# Patient Record
Sex: Female | Born: 1980 | Race: White | Hispanic: No | Marital: Married | Smoking: Current every day smoker
Health system: Southern US, Community
[De-identification: ages and names within clinical notes are randomized; demographics above are authoritative.]

## PROBLEM LIST (undated history)

## (undated) DIAGNOSIS — B182 Chronic viral hepatitis C: Secondary | ICD-10-CM

## (undated) DIAGNOSIS — F1910 Other psychoactive substance abuse, uncomplicated: Secondary | ICD-10-CM

## (undated) DIAGNOSIS — F3281 Premenstrual dysphoric disorder: Secondary | ICD-10-CM

## (undated) HISTORY — PX: PATELLECTOMY: SHX1022

## (undated) HISTORY — PX: KNEE ARTHROCENTESIS: SUR44

## (undated) HISTORY — PX: TUBAL LIGATION: SHX77

---

## 2006-05-05 ENCOUNTER — Observation Stay: Payer: Self-pay

## 2006-08-04 ENCOUNTER — Observation Stay: Payer: Self-pay | Admitting: Obstetrics and Gynecology

## 2006-08-11 ENCOUNTER — Inpatient Hospital Stay: Payer: Self-pay

## 2007-12-17 ENCOUNTER — Emergency Department: Payer: Self-pay | Admitting: Emergency Medicine

## 2008-07-25 ENCOUNTER — Emergency Department: Payer: Self-pay | Admitting: Emergency Medicine

## 2008-11-14 ENCOUNTER — Inpatient Hospital Stay: Payer: Self-pay | Admitting: Specialist

## 2011-02-27 ENCOUNTER — Emergency Department: Payer: Self-pay | Admitting: Emergency Medicine

## 2012-04-03 ENCOUNTER — Emergency Department: Payer: Self-pay | Admitting: Emergency Medicine

## 2012-05-07 ENCOUNTER — Emergency Department: Payer: Self-pay | Admitting: *Deleted

## 2013-01-27 ENCOUNTER — Emergency Department: Payer: Self-pay | Admitting: Internal Medicine

## 2013-11-16 ENCOUNTER — Emergency Department: Payer: Self-pay | Admitting: Internal Medicine

## 2013-11-16 LAB — BASIC METABOLIC PANEL
ANION GAP: 2 — AB (ref 7–16)
BUN: 9 mg/dL (ref 7–18)
CHLORIDE: 103 mmol/L (ref 98–107)
CO2: 28 mmol/L (ref 21–32)
Calcium, Total: 9.3 mg/dL (ref 8.5–10.1)
Creatinine: 0.91 mg/dL (ref 0.60–1.30)
EGFR (African American): >60
EGFR (Non-African Amer.): >60
GLUCOSE: 96 mg/dL (ref 65–99)
Osmolality: 265 (ref 275–301)
POTASSIUM: 3.7 mmol/L (ref 3.5–5.1)
SODIUM: 133 mmol/L — AB (ref 136–145)

## 2013-11-16 LAB — CBC
HCT: 37.6 % (ref 35.0–47.0)
HGB: 12.6 g/dL (ref 12.0–16.0)
MCH: 27.6 pg (ref 26.0–34.0)
MCHC: 33.6 g/dL (ref 32.0–36.0)
MCV: 82 fL (ref 80–100)
Platelet: 245 10*3/uL (ref 150–440)
RBC: 4.58 10*6/uL (ref 3.80–5.20)
RDW: 15.3 % — ABNORMAL HIGH (ref 11.5–14.5)
WBC: 10.8 10*3/uL (ref 3.6–11.0)

## 2014-01-03 ENCOUNTER — Emergency Department: Payer: Self-pay | Admitting: Emergency Medicine

## 2014-01-03 LAB — BASIC METABOLIC PANEL
ANION GAP: 4 — AB (ref 7–16)
BUN: 8 mg/dL (ref 7–18)
CALCIUM: 9 mg/dL (ref 8.5–10.1)
CHLORIDE: 100 mmol/L (ref 98–107)
CREATININE: 0.91 mg/dL (ref 0.60–1.30)
Co2: 29 mmol/L (ref 21–32)
EGFR (Non-African Amer.): >60
Glucose: 85 mg/dL (ref 65–99)
Osmolality: 264 (ref 275–301)
Potassium: 3.8 mmol/L (ref 3.5–5.1)
SODIUM: 133 mmol/L — AB (ref 136–145)

## 2014-01-03 LAB — CBC WITH DIFFERENTIAL/PLATELET
BASOS ABS: 0.1 10*3/uL (ref 0.0–0.1)
Basophil %: 1 %
EOS ABS: 0.1 10*3/uL (ref 0.0–0.7)
EOS PCT: 1.7 %
HCT: 35.8 % (ref 35.0–47.0)
HGB: 11.8 g/dL — ABNORMAL LOW (ref 12.0–16.0)
Lymphocyte #: 1.3 10*3/uL (ref 1.0–3.6)
Lymphocyte %: 17.5 %
MCH: 26.5 pg (ref 26.0–34.0)
MCHC: 32.9 g/dL (ref 32.0–36.0)
MCV: 81 fL (ref 80–100)
MONOS PCT: 7.8 %
Monocyte #: 0.6 x10 3/mm (ref 0.2–0.9)
NEUTROS ABS: 5.5 10*3/uL (ref 1.4–6.5)
Neutrophil %: 72 %
Platelet: 231 10*3/uL (ref 150–440)
RBC: 4.43 10*6/uL (ref 3.80–5.20)
RDW: 16.6 % — ABNORMAL HIGH (ref 11.5–14.5)
WBC: 7.7 10*3/uL (ref 3.6–11.0)

## 2014-01-03 LAB — TROPONIN I

## 2014-01-08 LAB — CULTURE, BLOOD (SINGLE)

## 2014-02-15 ENCOUNTER — Emergency Department: Payer: Self-pay

## 2014-02-15 ENCOUNTER — Emergency Department: Payer: Self-pay | Admitting: Emergency Medicine

## 2014-02-15 LAB — ETHANOL: Ethanol %: <0.003 % (ref 0.000–0.080)

## 2014-02-15 LAB — COMPREHENSIVE METABOLIC PANEL
ALT: 17 U/L (ref 12–78)
AST: 25 U/L (ref 15–37)
Albumin: 3.6 g/dL (ref 3.4–5.0)
Alkaline Phosphatase: 80 U/L
Anion Gap: 7 (ref 7–16)
BILIRUBIN TOTAL: 0.4 mg/dL (ref 0.2–1.0)
BUN: 12 mg/dL (ref 7–18)
CREATININE: 1.15 mg/dL (ref 0.60–1.30)
Calcium, Total: 9.3 mg/dL (ref 8.5–10.1)
Chloride: 101 mmol/L (ref 98–107)
Co2: 25 mmol/L (ref 21–32)
Glucose: 117 mg/dL — ABNORMAL HIGH (ref 65–99)
Osmolality: 267 (ref 275–301)
Potassium: 4.1 mmol/L (ref 3.5–5.1)
SODIUM: 133 mmol/L — AB (ref 136–145)
TOTAL PROTEIN: 8.7 g/dL — AB (ref 6.4–8.2)

## 2014-02-15 LAB — CBC
HCT: 35.3 % (ref 35.0–47.0)
HGB: 11.3 g/dL — ABNORMAL LOW (ref 12.0–16.0)
MCH: 25.3 pg — ABNORMAL LOW (ref 26.0–34.0)
MCHC: 32.1 g/dL (ref 32.0–36.0)
MCV: 79 fL — AB (ref 80–100)
PLATELETS: 221 10*3/uL (ref 150–440)
RBC: 4.48 10*6/uL (ref 3.80–5.20)
RDW: 17.1 % — ABNORMAL HIGH (ref 11.5–14.5)
WBC: 4.3 10*3/uL (ref 3.6–11.0)

## 2014-02-15 LAB — SALICYLATE LEVEL: SALICYLATES, SERUM: 1.8 mg/dL

## 2014-02-15 LAB — ACETAMINOPHEN LEVEL

## 2014-02-16 LAB — URINALYSIS, COMPLETE
Bacteria: NONE SEEN
Bilirubin,UR: NEGATIVE
Blood: NEGATIVE
Glucose,UR: NEGATIVE mg/dL (ref 0–75)
Ketone: NEGATIVE
Leukocyte Esterase: NEGATIVE
NITRITE: NEGATIVE
PH: 5 (ref 4.5–8.0)
PROTEIN: NEGATIVE
RBC,UR: 1 /HPF (ref 0–5)
SPECIFIC GRAVITY: 1.015 (ref 1.003–1.030)

## 2014-02-16 LAB — DRUG SCREEN, URINE
AMPHETAMINES, UR SCREEN: NEGATIVE (ref ?–1000)
BARBITURATES, UR SCREEN: NEGATIVE (ref ?–200)
BENZODIAZEPINE, UR SCRN: NEGATIVE (ref ?–200)
CANNABINOID 50 NG, UR ~~LOC~~: NEGATIVE (ref ?–50)
Cocaine Metabolite,Ur ~~LOC~~: POSITIVE (ref ?–300)
MDMA (ECSTASY) UR SCREEN: NEGATIVE (ref ?–500)
METHADONE, UR SCREEN: NEGATIVE (ref ?–300)
Opiate, Ur Screen: POSITIVE (ref ?–300)
Phencyclidine (PCP) Ur S: NEGATIVE (ref ?–25)
Tricyclic, Ur Screen: NEGATIVE (ref ?–1000)

## 2014-03-12 ENCOUNTER — Emergency Department: Payer: Self-pay | Admitting: Emergency Medicine

## 2014-03-12 LAB — DRUG SCREEN, URINE
AMPHETAMINES, UR SCREEN: NEGATIVE (ref ?–1000)
BARBITURATES, UR SCREEN: NEGATIVE (ref ?–200)
BENZODIAZEPINE, UR SCRN: NEGATIVE (ref ?–200)
Cannabinoid 50 Ng, Ur ~~LOC~~: NEGATIVE (ref ?–50)
Cocaine Metabolite,Ur ~~LOC~~: POSITIVE (ref ?–300)
MDMA (ECSTASY) UR SCREEN: NEGATIVE (ref ?–500)
Methadone, Ur Screen: NEGATIVE (ref ?–300)
Opiate, Ur Screen: POSITIVE (ref ?–300)
Phencyclidine (PCP) Ur S: NEGATIVE (ref ?–25)
Tricyclic, Ur Screen: NEGATIVE (ref ?–1000)

## 2014-03-12 LAB — CBC
HCT: 36.4 % (ref 35.0–47.0)
HGB: 11.7 g/dL — ABNORMAL LOW (ref 12.0–16.0)
MCH: 25.8 pg — AB (ref 26.0–34.0)
MCHC: 32.2 g/dL (ref 32.0–36.0)
MCV: 80 fL (ref 80–100)
PLATELETS: 217 10*3/uL (ref 150–440)
RBC: 4.54 10*6/uL (ref 3.80–5.20)
RDW: 17.2 % — AB (ref 11.5–14.5)
WBC: 9.8 10*3/uL (ref 3.6–11.0)

## 2014-03-12 LAB — COMPREHENSIVE METABOLIC PANEL
ALBUMIN: 3.7 g/dL (ref 3.4–5.0)
Alkaline Phosphatase: 70 U/L
Anion Gap: 4 — ABNORMAL LOW (ref 7–16)
BUN: 12 mg/dL (ref 7–18)
Bilirubin,Total: 0.2 mg/dL (ref 0.2–1.0)
CHLORIDE: 104 mmol/L (ref 98–107)
Calcium, Total: 9.1 mg/dL (ref 8.5–10.1)
Co2: 27 mmol/L (ref 21–32)
Creatinine: 0.99 mg/dL (ref 0.60–1.30)
EGFR (African American): >60
EGFR (Non-African Amer.): >60
Glucose: 87 mg/dL (ref 65–99)
Osmolality: 269 (ref 275–301)
Potassium: 3.8 mmol/L (ref 3.5–5.1)
SGOT(AST): 26 U/L (ref 15–37)
SGPT (ALT): 20 U/L (ref 12–78)
SODIUM: 135 mmol/L — AB (ref 136–145)
Total Protein: 8.1 g/dL (ref 6.4–8.2)

## 2014-03-12 LAB — URINALYSIS, COMPLETE
BILIRUBIN, UR: NEGATIVE
Blood: NEGATIVE
GLUCOSE, UR: NEGATIVE mg/dL (ref 0–75)
Ketone: NEGATIVE
Nitrite: NEGATIVE
Ph: 5 (ref 4.5–8.0)
Protein: NEGATIVE
RBC,UR: 3 /HPF (ref 0–5)
SPECIFIC GRAVITY: 1.016 (ref 1.003–1.030)

## 2014-03-12 LAB — SALICYLATE LEVEL: Salicylates, Serum: 2.6 mg/dL

## 2014-03-12 LAB — ACETAMINOPHEN LEVEL

## 2014-03-12 LAB — ETHANOL
Ethanol %: <0.003 % (ref 0.000–0.080)
Ethanol: <3 mg/dL

## 2014-05-30 ENCOUNTER — Emergency Department (HOSPITAL_COMMUNITY)
Admission: EM | Admit: 2014-05-30 | Discharge: 2014-05-30 | Disposition: A | Discharge status: Home / Self Care | Payer: Medicaid Other | Attending: Emergency Medicine | Admitting: Emergency Medicine

## 2014-05-30 ENCOUNTER — Encounter (HOSPITAL_COMMUNITY): Payer: Self-pay | Admitting: Emergency Medicine

## 2014-05-30 ENCOUNTER — Emergency Department (HOSPITAL_COMMUNITY): Payer: Medicaid Other

## 2014-05-30 DIAGNOSIS — K029 Dental caries, unspecified: Secondary | ICD-10-CM

## 2014-05-30 DIAGNOSIS — F17200 Nicotine dependence, unspecified, uncomplicated: Secondary | ICD-10-CM | POA: Diagnosis not present

## 2014-05-30 DIAGNOSIS — Z8619 Personal history of other infectious and parasitic diseases: Secondary | ICD-10-CM | POA: Insufficient documentation

## 2014-05-30 DIAGNOSIS — X500XXA Overexertion from strenuous movement or load, initial encounter: Secondary | ICD-10-CM | POA: Diagnosis not present

## 2014-05-30 DIAGNOSIS — Y9289 Other specified places as the place of occurrence of the external cause: Secondary | ICD-10-CM | POA: Diagnosis not present

## 2014-05-30 DIAGNOSIS — K089 Disorder of teeth and supporting structures, unspecified: Secondary | ICD-10-CM | POA: Diagnosis present

## 2014-05-30 DIAGNOSIS — S8990XA Unspecified injury of unspecified lower leg, initial encounter: Secondary | ICD-10-CM | POA: Diagnosis not present

## 2014-05-30 DIAGNOSIS — S99929A Unspecified injury of unspecified foot, initial encounter: Secondary | ICD-10-CM

## 2014-05-30 DIAGNOSIS — Y9389 Activity, other specified: Secondary | ICD-10-CM | POA: Diagnosis not present

## 2014-05-30 DIAGNOSIS — S99919A Unspecified injury of unspecified ankle, initial encounter: Secondary | ICD-10-CM

## 2014-05-30 DIAGNOSIS — M25562 Pain in left knee: Secondary | ICD-10-CM

## 2014-05-30 HISTORY — DX: Chronic viral hepatitis C: B18.2

## 2014-05-30 MED ORDER — OXYCODONE HCL 5 MG PO TABS: 5.0000 mg | ORAL_TABLET | ORAL | Status: DC | PRN

## 2014-05-30 MED ORDER — PENICILLIN V POTASSIUM 500 MG PO TABS: 500.0000 mg | ORAL_TABLET | Freq: Three times a day (TID) | ORAL | Status: DC

## 2014-05-30 MED ORDER — OXYCODONE HCL 5 MG PO TABS
5.0000 mg | ORAL_TABLET | Freq: Once | ORAL | Status: AC
  Administered 2014-05-30: 5 mg via ORAL
  Filled 2014-05-30: qty 1

## 2014-05-30 MED ORDER — NAPROXEN 500 MG PO TABS: 500.0000 mg | ORAL_TABLET | Freq: Two times a day (BID) | ORAL | Status: DC

## 2014-05-30 NOTE — ED Notes (Signed)
Pt c/o right lower dental pain x 2 weeks worse today; pt sts pain in left knee after twisting; pt sts hx of problems with same knee

## 2014-05-30 NOTE — ED Notes (Signed)
Patient returned from X-ray 

## 2014-05-30 NOTE — ED Provider Notes (Signed)
CSN: [id number]     Arrival date & time 05/30/14  1612 History  This chart was scribed for non-physician practitioner, Felicie Mornavid Noga Fogg, NP working with Richardean Canalavid H Yao, MD by Greggory StallionKayla Andersen, ED scribe. This patient was seen in room TR06C/TR06C and the patient's care was started at 5:46 PM.   Chief Complaint  Patient presents with  . Dental Pain   The history is provided by the patient. No language interpreter was used.   HPI Comments: Jasmine [name] is a [age] y.o. female who presents to the Emergency Department complaining of gradual onset right lower dental pain that started 2 weeks ago. States it worsened today. She tried to call her dentist to get an appointment but was unsuccessful. Denies fever. Pt is also complaining of left knee pain that started after she twisted it while getting out of the shower 2 days ago. Reports history of problems with left knee. States she had limited flexion of her knee with past injury but physical therapy improved it. She states she can not flex it since injury 2 days ago.   Past Medical History  Diagnosis Date  . Hep C w/o coma, chronic    History reviewed. No pertinent past surgical history. History reviewed. No pertinent family history. History  Substance Use Topics  . Smoking status: Current Every Day Smoker  . Smokeless tobacco: Not on file  . Alcohol Use: Yes     Comment: occ   OB History   Grav Para Term Preterm Abortions TAB SAB Ect Mult Living                 Review of Systems  Constitutional: Negative for fever.  HENT: Positive for dental problem.   Musculoskeletal: Positive for arthralgias.  All other systems reviewed and are negative.  Allergies  Review of patient's allergies indicates no known allergies.  Home Medications   Prior to Admission medications   Not on File   BP 127/69  Pulse 92  Temp(Src) 98.5 F (36.9 C) (Oral)  Resp 16  SpO2 99%  LMP 05/30/2014  Physical Exam  Nursing note and vitals reviewed. Constitutional:  She is oriented to person, place, and time. She appears well-developed and well-nourished. No distress.  HENT:  Head: Normocephalic and atraumatic.  Tooth #29 broken down to gumline.   Eyes: Conjunctivae and EOM are normal.  Neck: Neck supple. No tracheal deviation present.  Cardiovascular: Normal rate, regular rhythm, normal heart sounds and intact distal pulses.   Pulmonary/Chest: Effort normal and breath sounds normal. No respiratory distress. She has no wheezes. She has no rales.  Musculoskeletal: Normal range of motion.  Limited flexion of left knee. Diffuse tenderness.   Neurological: She is alert and oriented to person, place, and time.  Skin: Skin is warm and dry.  Psychiatric: She has a normal mood and affect. Her behavior is normal.    ED Course  Procedures (including critical care time)  DIAGNOSTIC STUDIES: Oxygen Saturation is 99% on RA, normal by my interpretation.    COORDINATION OF CARE: 5:50 PM-Discussed treatment plan which includes knee xray, an antibiotic and pain medication with pt at bedside and pt agreed to plan. Will give pt dental and orthopedic referrals and advised her to follow up.    Labs Review Labs Reviewed - No data to display  Imaging Review Dg Knee Complete 4 Views Left  05/30/2014   CLINICAL DATA:  Left knee pain since a twisting injury 2 days ago. Previous patella surgery.  EXAM:  LEFT KNEE - COMPLETE 4+ VIEW  COMPARISON:  None.  FINDINGS: There is no fracture, dislocation, or joint effusion. There is slight deformity of the lower pole of the patella. Medial and lateral compartments appear normal.  IMPRESSION: No acute abnormalities.   Electronically Signed   By: Geanie Cooley M.D.   On: 05/30/2014 17:56     EKG Interpretation None     Radiology results reviewed and shared with patient. MDM   Final diagnoses:  None    Left knee pain.  No acute finding on xray.  Follow-up with orthopedist who performed prior surgery.  Dental pain.   Antibiotic, analgesic, dental follow-up.  I personally performed the services described in this documentation, which was scribed in my presence. The recorded information has been reviewed and is accurate.  Jimmye Norman, NP 05/31/14 [phone]

## 2014-05-30 NOTE — Discharge Instructions (Signed)
Dental Pain  A tooth ache may be caused by cavities (tooth decay). Cavities expose the nerve of the tooth to air and hot or cold temperatures. It may come from an infection or abscess (also called a boil or furuncle) around your tooth. It is also often caused by dental caries (tooth decay). This causes the pain you are having.  DIAGNOSIS   Your caregiver can diagnose this problem by exam.  TREATMENT    If caused by an infection, it may be treated with medications which kill germs (antibiotics) and pain medications as prescribed by your caregiver. Take medications as directed.   Only take over-the-counter or prescription medicines for pain, discomfort, or fever as directed by your caregiver.   Whether the tooth ache today is caused by infection or dental disease, you should see your dentist as soon as possible for further care.  SEEK MEDICAL CARE IF:  The exam and treatment you received today has been provided on an emergency basis only. This is not a substitute for complete medical or dental care. If your problem worsens or new problems (symptoms) appear, and you are unable to meet with your dentist, call or return to this location.  SEEK IMMEDIATE MEDICAL CARE IF:    You have a fever.   You develop redness and swelling of your face, jaw, or neck.   You are unable to open your mouth.   You have severe pain uncontrolled by pain medicine.  MAKE SURE YOU:    Understand these instructions.   Will watch your condition.   Will get help right away if you are not doing well or get worse.  Document Released: 09/29/2005 Document Revised: 12/22/2011 Document Reviewed: 05/17/2008  ExitCare Patient Information 2015 [employer]. This information is not intended to replace advice given to you by your health care provider. Make sure you discuss any questions you have with your health care provider.  Knee Pain  The knee is the complex joint between your thigh and your lower leg. It is made up of bones, tendons,  ligaments, and cartilage. The bones that make up the knee are:   The femur in the thigh.   The tibia and fibula in the lower leg.   The patella or kneecap riding in the groove on the lower femur.  CAUSES   Knee pain is a common complaint with many causes. A few of these causes are:   Injury, such as:   A ruptured ligament or tendon injury.   Torn cartilage.   Medical conditions, such as:   Gout   Arthritis   Infections   Overuse, over training, or overdoing a physical activity.  Knee pain can be minor or severe. Knee pain can accompany debilitating injury. Minor knee problems often respond well to self-care measures or get well on their own. More serious injuries may need medical intervention or even surgery.  SYMPTOMS  The knee is complex. Symptoms of knee problems can vary widely. Some of the problems are:   Pain with movement and weight bearing.   Swelling and tenderness.   Buckling of the knee.   Inability to straighten or extend your knee.   Your knee locks and you cannot straighten it.   Warmth and redness with pain and fever.   Deformity or dislocation of the kneecap.  DIAGNOSIS   Determining what is wrong may be very straight forward such as when there is an injury. It can also be challenging because of the complexity of the   knee. Tests to make a diagnosis may include:   Your caregiver taking a history and doing a physical exam.   Routine X-rays can be used to rule out other problems. X-rays will not reveal a cartilage tear. Some injuries of the knee can be diagnosed by:   Arthroscopy a surgical technique by which a small video camera is inserted through tiny incisions on the sides of the knee. This procedure is used to examine and repair internal knee joint problems. Tiny instruments can be used during arthroscopy to repair the torn knee cartilage (meniscus).   Arthrography is a radiology technique. A contrast liquid is directly injected into the knee joint. Internal structures of the  knee joint then become visible on X-ray film.   An MRI scan is a non X-ray radiology procedure in which magnetic fields and a computer produce two- or three-dimensional images of the inside of the knee. Cartilage tears are often visible using an MRI scanner. MRI scans have largely replaced arthrography in diagnosing cartilage tears of the knee.   Blood work.   Examination of the fluid that helps to lubricate the knee joint (synovial fluid). This is done by taking a sample out using a needle and a syringe.  TREATMENT  The treatment of knee problems depends on the cause. Some of these treatments are:   Depending on the injury, proper casting, splinting, surgery, or physical therapy care will be needed.   Give yourself adequate recovery time. Do not overuse your joints. If you begin to get sore during workout routines, back off. Slow down or do fewer repetitions.   For repetitive activities such as cycling or running, maintain your strength and nutrition.   Alternate muscle groups. For example, if you are a weight lifter, work the upper body on one day and the lower body the next.   Either tight or weak muscles do not give the proper support for your knee. Tight or weak muscles do not absorb the stress placed on the knee joint. Keep the muscles surrounding the knee strong.   Take care of mechanical problems.   If you have flat feet, orthotics or special shoes may help. See your caregiver if you need help.   Arch supports, sometimes with wedges on the inner or outer aspect of the heel, can help. These can shift pressure away from the side of the knee most bothered by osteoarthritis.   A brace called an "unloader" brace also may be used to help ease the pressure on the most arthritic side of the knee.   If your caregiver has prescribed crutches, braces, wraps or ice, use as directed. The acronym for this is PRICE. This means protection, rest, ice, compression, and elevation.   Nonsteroidal anti-inflammatory  drugs (NSAIDs), can help relieve pain. But if taken immediately after an injury, they may actually increase swelling. Take NSAIDs with food in your stomach. Stop them if you develop stomach problems. Do not take these if you have a history of ulcers, stomach pain, or bleeding from the bowel. Do not take without your caregiver's approval if you have problems with fluid retention, heart failure, or kidney problems.   For ongoing knee problems, physical therapy may be helpful.   Glucosamine and chondroitin are over-the-counter dietary supplements. Both may help relieve the pain of osteoarthritis in the knee. These medicines are different from the usual anti-inflammatory drugs. Glucosamine may decrease the rate of cartilage destruction.   Injections of a corticosteroid drug into your knee joint may   help reduce the symptoms of an arthritis flare-up. They may provide pain relief that lasts a few months. You may have to wait a few months between injections. The injections do have a small increased risk of infection, water retention, and elevated blood sugar levels.   Hyaluronic acid injected into damaged joints may ease pain and provide lubrication. These injections may work by reducing inflammation. A series of shots may give relief for as long as 6 months.   Topical painkillers. Applying certain ointments to your skin may help relieve the pain and stiffness of osteoarthritis. Ask your pharmacist for suggestions. Many over the-counter products are approved for temporary relief of arthritis pain.   In some countries, doctors often prescribe topical NSAIDs for relief of chronic conditions such as arthritis and tendinitis. A review of treatment with NSAID creams found that they worked as well as oral medications but without the serious side effects.  PREVENTION   Maintain a healthy weight. Extra pounds put more strain on your joints.   Get strong, stay limber. Weak muscles are a common cause of knee injuries.  Stretching is important. Include flexibility exercises in your workouts.   Be smart about exercise. If you have osteoarthritis, chronic knee pain or recurring injuries, you may need to change the way you exercise. This does not mean you have to stop being active. If your knees ache after jogging or playing basketball, consider switching to swimming, water aerobics, or other low-impact activities, at least for a few days a week. Sometimes limiting high-impact activities will provide relief.   Make sure your shoes fit well. Choose footwear that is right for your sport.   Protect your knees. Use the proper gear for knee-sensitive activities. Use kneepads when playing volleyball or laying carpet. Buckle your seat belt every time you drive. Most shattered kneecaps occur in car accidents.   Rest when you are tired.  SEEK MEDICAL CARE IF:   You have knee pain that is continual and does not seem to be getting better.   SEEK IMMEDIATE MEDICAL CARE IF:   Your knee joint feels hot to the touch and you have a high fever.  MAKE SURE YOU:    Understand these instructions.   Will watch your condition.   Will get help right away if you are not doing well or get worse.  Document Released: 07/27/2007 Document Revised: 12/22/2011 Document Reviewed: 07/27/2007  [employer] Patient Information 2015 [employer]. This information is not intended to replace advice given to you by your health care provider. Make sure you discuss any questions you have with your health care provider.

## 2014-05-31 NOTE — ED Provider Notes (Signed)
Medical screening examination/treatment/procedure(s) were performed by non-physician practitioner and as supervising physician I was immediately available for consultation/collaboration.   EKG Interpretation None        David H Yao, MD 05/31/14 0956 

## 2014-06-06 ENCOUNTER — Emergency Department (HOSPITAL_COMMUNITY)
Admission: EM | Admit: 2014-06-06 | Discharge: 2014-06-06 | Disposition: A | Discharge status: Home / Self Care | Payer: Medicaid Other | Attending: Family Medicine | Admitting: Family Medicine

## 2014-06-06 ENCOUNTER — Encounter (HOSPITAL_COMMUNITY): Payer: Self-pay | Admitting: Emergency Medicine

## 2014-06-06 DIAGNOSIS — Z791 Long term (current) use of non-steroidal anti-inflammatories (NSAID): Secondary | ICD-10-CM | POA: Insufficient documentation

## 2014-06-06 DIAGNOSIS — M25569 Pain in unspecified knee: Secondary | ICD-10-CM | POA: Insufficient documentation

## 2014-06-06 DIAGNOSIS — F17200 Nicotine dependence, unspecified, uncomplicated: Secondary | ICD-10-CM | POA: Diagnosis not present

## 2014-06-06 DIAGNOSIS — Z9889 Other specified postprocedural states: Secondary | ICD-10-CM | POA: Insufficient documentation

## 2014-06-06 DIAGNOSIS — M25562 Pain in left knee: Secondary | ICD-10-CM

## 2014-06-06 DIAGNOSIS — Z792 Long term (current) use of antibiotics: Secondary | ICD-10-CM | POA: Insufficient documentation

## 2014-06-06 DIAGNOSIS — G8929 Other chronic pain: Secondary | ICD-10-CM | POA: Diagnosis not present

## 2014-06-06 MED ORDER — KETOROLAC TROMETHAMINE 10 MG PO TABS: 10.0000 mg | ORAL_TABLET | Freq: Four times a day (QID) | ORAL | Status: DC | PRN

## 2014-06-06 MED ORDER — KETOROLAC TROMETHAMINE 30 MG/ML IJ SOLN
30.0000 mg | Freq: Once | INTRAMUSCULAR | Status: AC
  Administered 2014-06-06: 30 mg via INTRAMUSCULAR
  Filled 2014-06-06: qty 1

## 2014-06-06 NOTE — ED Notes (Signed)
C/o pain in left knee onset 2 weeks ago states she isn't able to work. States she isn't able to see ortho until next month.

## 2014-06-06 NOTE — ED Provider Notes (Signed)
CSN: [id number]     Arrival date & time 06/06/14  1213 History   First MD Initiated Contact with Patient 06/06/14 1322     Chief Complaint  Patient presents with  . Knee Pain     (Consider location/radiation/quality/duration/timing/severity/associated sxs/prior Treatment) Patient is a 33 y.o. female presenting with knee pain. The history is provided by the patient.  Knee Pain Location:  Knee Time since incident:  10 days Injury: no   Knee location:  L knee Pain details:    Quality:  Sharp   Radiates to:  Does not radiate   Severity:  Moderate   Onset quality:  Gradual Chronicity:  Recurrent (seen 8/18 for knee pain, s/p tkr in [place] unable to get ortho f/u for sev wks.) Dislocation: no   Prior injury to area:  Yes Ineffective treatments:  None tried Associated symptoms: decreased ROM and stiffness   Associated symptoms: no back pain, no numbness and no swelling     Past Medical History  Diagnosis Date  . Hep C w/o coma, chronic    Past Surgical History  Procedure Laterality Date  . Knee arthrocentesis Left   . Tubal ligation N/A    History reviewed. No pertinent family history. History  Substance Use Topics  . Smoking status: Current Every Day Smoker  . Smokeless tobacco: Not on file  . Alcohol Use: Yes     Comment: occ   OB History   Grav Para Term Preterm Abortions TAB SAB Ect Mult Living                 Review of Systems  Constitutional: Negative.   Musculoskeletal: Positive for gait problem and stiffness. Negative for back pain and joint swelling.  Skin: Negative.       Allergies  Review of patient's allergies indicates no known allergies.  Home Medications   Prior to Admission medications   Medication Sig Start Date End Date Taking? Authorizing Provider  ketorolac (TORADOL) 10 MG tablet Take 1 tablet (10 mg total) by mouth every 6 (six) hours as needed. For pain 06/06/14   Linna Hoff, MD  naproxen (NAPROSYN) 500 MG tablet Take 1 tablet (500  mg total) by mouth 2 (two) times daily. 05/30/14   Jimmye Norman, NP  oxyCODONE (ROXICODONE) 5 MG immediate release tablet Take 1 tablet (5 mg total) by mouth every 4 (four) hours as needed for severe pain. 05/30/14   Jimmye Norman, NP  penicillin v potassium (VEETID) 500 MG tablet Take 1 tablet (500 mg total) by mouth 3 (three) times daily. 05/30/14   Jimmye Norman, NP   BP 107/59  Pulse 75  Temp(Src) 98.1 F (36.7 C) (Oral)  Resp 16  Ht  (1.778 m)  Wt 165 lb (74.844 kg)  BMI 23.68 kg/m2  SpO2 100%  LMP 05/30/2014 Physical Exam  Nursing note and vitals reviewed. Constitutional: She is oriented to person, place, and time. She appears well-developed and well-nourished.  Musculoskeletal: She exhibits tenderness.       Left knee: She exhibits decreased range of motion. She exhibits no swelling, no effusion and no bony tenderness. Tenderness found. Medial joint line and MCL tenderness noted. No patellar tendon tenderness noted.       Legs: Neurological: She is alert and oriented to person, place, and time.  Skin: Skin is warm and dry.    ED Course  Procedures (including critical care time) Labs Review Labs Reviewed - No data to display  Imaging  Review No results found.   EKG Interpretation None      MDM   Final diagnoses:  Knee pain, chronic, left        Linna Hoff, MD 06/13/14 1248

## 2014-06-06 NOTE — ED Provider Notes (Signed)
CSN: [id number]     Arrival date & time 06/06/14  1213 History   First MD Initiated Contact with Patient 06/06/14 1322     Chief Complaint  Patient presents with  . Knee Pain     (Consider location/radiation/quality/duration/timing/severity/associated sxs/prior Treatment) HPI  Past Medical History  Diagnosis Date  . Hep C w/o coma, chronic    Past Surgical History  Procedure Laterality Date  . Knee arthrocentesis Left   . Tubal ligation N/A    History reviewed. No pertinent family history. History  Substance Use Topics  . Smoking status: Current Every Day Smoker  . Smokeless tobacco: Not on file  . Alcohol Use: Yes     Comment: occ   OB History   Grav Para Term Preterm Abortions TAB SAB Ect Mult Living                 Review of Systems    Allergies  Review of patient's allergies indicates no known allergies.  Home Medications   Prior to Admission medications   Medication Sig Start Date End Date Taking? Authorizing Provider  ketorolac (TORADOL) 10 MG tablet Take 1 tablet (10 mg total) by mouth every 6 (six) hours as needed. For pain 06/06/14   Linna Hoff, MD  naproxen (NAPROSYN) 500 MG tablet Take 1 tablet (500 mg total) by mouth 2 (two) times daily. 05/30/14   Jimmye Norman, NP  oxyCODONE (ROXICODONE) 5 MG immediate release tablet Take 1 tablet (5 mg total) by mouth every 4 (four) hours as needed for severe pain. 05/30/14   Jimmye Norman, NP  penicillin v potassium (VEETID) 500 MG tablet Take 1 tablet (500 mg total) by mouth 3 (three) times daily. 05/30/14   Jimmye Norman, NP   BP 107/59  Pulse 75  Temp(Src) 98.1 F (36.7 C) (Oral)  Resp 16  Ht  (1.778 m)  Wt 165 lb (74.844 kg)  BMI 23.68 kg/m2  SpO2 100%  LMP 05/30/2014 Physical Exam  Nursing note and vitals reviewed. Constitutional: She is oriented to person, place, and time. She appears well-developed and well-nourished.  Musculoskeletal: She exhibits tenderness.       Left knee: She  exhibits normal range of motion, no swelling, no effusion, no deformity, normal alignment, normal patellar mobility, no bony tenderness, normal meniscus and no MCL laxity. Tenderness found. Medial joint line and lateral joint line tenderness noted.       Legs: Neurological: She is alert and oriented to person, place, and time.  Skin: Skin is warm and dry.    ED Course  Procedures (including critical care time) Labs Review Labs Reviewed - No data to display  Imaging Review No results found.   EKG Interpretation None      MDM   Final diagnoses:  Knee pain, chronic, left        Linna Hoff, MD 06/06/14 [phone]

## 2014-06-06 NOTE — Discharge Instructions (Signed)
Wear knee support as needed and use medicine as needed, see your orthopedist as planned.

## 2014-06-07 NOTE — ED Provider Notes (Signed)
CSN: [id number]     Arrival date & time 06/06/14  1213 History   First MD Initiated Contact with Patient 06/06/14 1322     Chief Complaint  Patient presents with  . Knee Pain     (Consider location/radiation/quality/duration/timing/severity/associated sxs/prior Treatment) HPI  Past Medical History  Diagnosis Date  . Hep C w/o coma, chronic    Past Surgical History  Procedure Laterality Date  . Knee arthrocentesis Left   . Tubal ligation N/A    History reviewed. No pertinent family history. History  Substance Use Topics  . Smoking status: Current Every Day Smoker  . Smokeless tobacco: Not on file  . Alcohol Use: Yes     Comment: occ   OB History   Grav Para Term Preterm Abortions TAB SAB Ect Mult Living                 Review of Systems    Allergies  Review of patient's allergies indicates no known allergies.  Home Medications   Prior to Admission medications   Medication Sig Start Date End Date Taking? Authorizing Provider  ketorolac (TORADOL) 10 MG tablet Take 1 tablet (10 mg total) by mouth every 6 (six) hours as needed. For pain 06/06/14   Linna Hoff, MD  naproxen (NAPROSYN) 500 MG tablet Take 1 tablet (500 mg total) by mouth 2 (two) times daily. 05/30/14   Jimmye Norman, NP  oxyCODONE (ROXICODONE) 5 MG immediate release tablet Take 1 tablet (5 mg total) by mouth every 4 (four) hours as needed for severe pain. 05/30/14   Jimmye Norman, NP  penicillin v potassium (VEETID) 500 MG tablet Take 1 tablet (500 mg total) by mouth 3 (three) times daily. 05/30/14   Jimmye Norman, NP   BP 107/59  Pulse 75  Temp(Src) 98.1 F (36.7 C) (Oral)  Resp 16  Ht  (1.778 m)  Wt 165 lb (74.844 kg)  BMI 23.68 kg/m2  SpO2 100%  LMP 05/30/2014 Physical Exam  ED Course  Procedures (including critical care time) Labs Review Labs Reviewed - No data to display  Imaging Review No results found.   EKG Interpretation None      MDM   Final diagnoses:  Knee  pain, chronic, left        Linna Hoff, MD 06/07/14 [phone]

## 2014-08-02 ENCOUNTER — Emergency Department: Payer: Self-pay | Admitting: Emergency Medicine

## 2014-08-02 LAB — COMPREHENSIVE METABOLIC PANEL
ALBUMIN: 3.7 g/dL (ref 3.4–5.0)
ALK PHOS: 74 U/L
Anion Gap: 9 (ref 7–16)
BUN: 12 mg/dL (ref 7–18)
Bilirubin,Total: 0.4 mg/dL (ref 0.2–1.0)
CHLORIDE: 108 mmol/L — AB (ref 98–107)
CO2: 24 mmol/L (ref 21–32)
CREATININE: 0.97 mg/dL (ref 0.60–1.30)
Calcium, Total: 9 mg/dL (ref 8.5–10.1)
GLUCOSE: 126 mg/dL — AB (ref 65–99)
Osmolality: 283 (ref 275–301)
Potassium: 4.1 mmol/L (ref 3.5–5.1)
SGOT(AST): 60 U/L — ABNORMAL HIGH (ref 15–37)
SGPT (ALT): 130 U/L — ABNORMAL HIGH
SODIUM: 141 mmol/L (ref 136–145)
TOTAL PROTEIN: 8 g/dL (ref 6.4–8.2)

## 2014-08-02 LAB — LIPASE, BLOOD: Lipase: 71 U/L — ABNORMAL LOW (ref 73–393)

## 2014-08-02 LAB — CBC WITH DIFFERENTIAL/PLATELET
BASOS ABS: 0.1 10*3/uL (ref 0.0–0.1)
Basophil %: 1.1 %
Eosinophil #: 0.2 10*3/uL (ref 0.0–0.7)
Eosinophil %: 1.9 %
HCT: 41.9 % (ref 35.0–47.0)
HGB: 13.4 g/dL (ref 12.0–16.0)
LYMPHS ABS: 2.3 10*3/uL (ref 1.0–3.6)
Lymphocyte %: 21.2 %
MCH: 26.5 pg (ref 26.0–34.0)
MCHC: 31.9 g/dL — AB (ref 32.0–36.0)
MCV: 83 fL (ref 80–100)
MONO ABS: 0.6 x10 3/mm (ref 0.2–0.9)
Monocyte %: 5.6 %
NEUTROS PCT: 70.2 %
Neutrophil #: 7.7 10*3/uL — ABNORMAL HIGH (ref 1.4–6.5)
PLATELETS: 235 10*3/uL (ref 150–440)
RBC: 5.04 10*6/uL (ref 3.80–5.20)
RDW: 15.9 % — AB (ref 11.5–14.5)
WBC: 11 10*3/uL (ref 3.6–11.0)

## 2014-10-20 ENCOUNTER — Emergency Department: Payer: Self-pay | Admitting: Emergency Medicine

## 2014-10-20 LAB — COMPREHENSIVE METABOLIC PANEL
ALBUMIN: 3.4 g/dL (ref 3.4–5.0)
Alkaline Phosphatase: 90 U/L
Anion Gap: 5 — ABNORMAL LOW (ref 7–16)
BUN: 14 mg/dL (ref 7–18)
Bilirubin,Total: 0.3 mg/dL (ref 0.2–1.0)
CALCIUM: 8.8 mg/dL (ref 8.5–10.1)
CHLORIDE: 107 mmol/L (ref 98–107)
Co2: 26 mmol/L (ref 21–32)
Creatinine: 0.85 mg/dL (ref 0.60–1.30)
EGFR (Non-African Amer.): >60
Glucose: 101 mg/dL — ABNORMAL HIGH (ref 65–99)
OSMOLALITY: 276 (ref 275–301)
POTASSIUM: 4.3 mmol/L (ref 3.5–5.1)
SGOT(AST): 89 U/L — ABNORMAL HIGH (ref 15–37)
SGPT (ALT): 184 U/L — ABNORMAL HIGH
SODIUM: 138 mmol/L (ref 136–145)
Total Protein: 7.8 g/dL (ref 6.4–8.2)

## 2014-10-20 LAB — CBC
HCT: 38.2 % (ref 35.0–47.0)
HGB: 12.4 g/dL (ref 12.0–16.0)
MCH: 27 pg (ref 26.0–34.0)
MCHC: 32.4 g/dL (ref 32.0–36.0)
MCV: 83 fL (ref 80–100)
PLATELETS: 215 10*3/uL (ref 150–440)
RBC: 4.58 10*6/uL (ref 3.80–5.20)
RDW: 14.4 % (ref 11.5–14.5)
WBC: 8 10*3/uL (ref 3.6–11.0)

## 2014-10-20 LAB — DRUG SCREEN, URINE
AMPHETAMINES, UR SCREEN: NEGATIVE (ref ?–1000)
BENZODIAZEPINE, UR SCRN: NEGATIVE (ref ?–200)
Barbiturates, Ur Screen: NEGATIVE (ref ?–200)
Cannabinoid 50 Ng, Ur ~~LOC~~: NEGATIVE (ref ?–50)
Cocaine Metabolite,Ur ~~LOC~~: POSITIVE (ref ?–300)
MDMA (ECSTASY) UR SCREEN: NEGATIVE (ref ?–500)
Methadone, Ur Screen: NEGATIVE (ref ?–300)
Opiate, Ur Screen: POSITIVE (ref ?–300)
PHENCYCLIDINE (PCP) UR S: NEGATIVE (ref ?–25)
Tricyclic, Ur Screen: NEGATIVE (ref ?–1000)

## 2014-10-20 LAB — URINALYSIS, COMPLETE
Bilirubin,UR: NEGATIVE
Blood: NEGATIVE
Glucose,UR: NEGATIVE mg/dL (ref 0–75)
Ketone: NEGATIVE
Nitrite: POSITIVE
PH: 6 (ref 4.5–8.0)
Protein: NEGATIVE
RBC,UR: 10 /HPF (ref 0–5)
Specific Gravity: 1.014 (ref 1.003–1.030)
WBC UR: 23 /HPF (ref 0–5)

## 2014-10-20 LAB — ETHANOL: Ethanol: <3 mg/dL

## 2015-01-06 ENCOUNTER — Emergency Department (HOSPITAL_COMMUNITY): Payer: Medicaid Other

## 2015-01-06 ENCOUNTER — Encounter (HOSPITAL_COMMUNITY): Payer: Self-pay | Admitting: Emergency Medicine

## 2015-01-06 ENCOUNTER — Emergency Department (HOSPITAL_COMMUNITY)
Admission: EM | Admit: 2015-01-06 | Discharge: 2015-01-08 | Disposition: A | Discharge status: Home / Self Care | Payer: Medicaid Other | Attending: Emergency Medicine | Admitting: Emergency Medicine

## 2015-01-06 DIAGNOSIS — Z792 Long term (current) use of antibiotics: Secondary | ICD-10-CM | POA: Insufficient documentation

## 2015-01-06 DIAGNOSIS — M25562 Pain in left knee: Secondary | ICD-10-CM | POA: Diagnosis not present

## 2015-01-06 DIAGNOSIS — Z8619 Personal history of other infectious and parasitic diseases: Secondary | ICD-10-CM | POA: Insufficient documentation

## 2015-01-06 DIAGNOSIS — R45851 Suicidal ideations: Secondary | ICD-10-CM | POA: Diagnosis present

## 2015-01-06 DIAGNOSIS — F1110 Opioid abuse, uncomplicated: Secondary | ICD-10-CM | POA: Insufficient documentation

## 2015-01-06 DIAGNOSIS — L02414 Cutaneous abscess of left upper limb: Secondary | ICD-10-CM | POA: Diagnosis not present

## 2015-01-06 DIAGNOSIS — F1310 Sedative, hypnotic or anxiolytic abuse, uncomplicated: Secondary | ICD-10-CM | POA: Insufficient documentation

## 2015-01-06 DIAGNOSIS — F1410 Cocaine abuse, uncomplicated: Secondary | ICD-10-CM | POA: Insufficient documentation

## 2015-01-06 DIAGNOSIS — Z791 Long term (current) use of non-steroidal anti-inflammatories (NSAID): Secondary | ICD-10-CM | POA: Insufficient documentation

## 2015-01-06 DIAGNOSIS — M7981 Nontraumatic hematoma of soft tissue: Secondary | ICD-10-CM | POA: Insufficient documentation

## 2015-01-06 DIAGNOSIS — F1910 Other psychoactive substance abuse, uncomplicated: Secondary | ICD-10-CM

## 2015-01-06 DIAGNOSIS — Z720 Tobacco use: Secondary | ICD-10-CM | POA: Insufficient documentation

## 2015-01-06 HISTORY — DX: Other psychoactive substance abuse, uncomplicated: F19.10

## 2015-01-06 LAB — ACETAMINOPHEN LEVEL

## 2015-01-06 LAB — CBC
HEMATOCRIT: 41.8 % (ref 36.0–46.0)
Hemoglobin: 13.6 g/dL (ref 12.0–15.0)
MCH: 27.4 pg (ref 26.0–34.0)
MCHC: 32.5 g/dL (ref 30.0–36.0)
MCV: 84.1 fL (ref 78.0–100.0)
Platelets: 275 10*3/uL (ref 150–400)
RBC: 4.97 MIL/uL (ref 3.87–5.11)
RDW: 15.2 % (ref 11.5–15.5)
WBC: 8.5 10*3/uL (ref 4.0–10.5)

## 2015-01-06 LAB — SALICYLATE LEVEL: Salicylate Lvl: <4 mg/dL (ref 2.8–20.0)

## 2015-01-06 LAB — COMPREHENSIVE METABOLIC PANEL
ALT: 94 U/L — ABNORMAL HIGH (ref 0–35)
AST: 55 U/L — ABNORMAL HIGH (ref 0–37)
Albumin: 3.7 g/dL (ref 3.5–5.2)
Alkaline Phosphatase: 79 U/L (ref 39–117)
Anion gap: 7 (ref 5–15)
BILIRUBIN TOTAL: 0.4 mg/dL (ref 0.3–1.2)
BUN: 16 mg/dL (ref 6–23)
CO2: 27 mmol/L (ref 19–32)
Calcium: 9.3 mg/dL (ref 8.4–10.5)
Chloride: 105 mmol/L (ref 96–112)
Creatinine, Ser: 1.07 mg/dL (ref 0.50–1.10)
GFR calc Af Amer: 77 mL/min — ABNORMAL LOW (ref >90)
GFR calc non Af Amer: 66 mL/min — ABNORMAL LOW (ref >90)
Glucose, Bld: 123 mg/dL — ABNORMAL HIGH (ref 70–99)
POTASSIUM: 3.5 mmol/L (ref 3.5–5.1)
Sodium: 139 mmol/L (ref 135–145)
Total Protein: 8.4 g/dL — ABNORMAL HIGH (ref 6.0–8.3)

## 2015-01-06 LAB — ETHANOL: Alcohol, Ethyl (B): <5 mg/dL (ref 0–9)

## 2015-01-06 MED ORDER — LIDOCAINE-EPINEPHRINE 2 %-1:100000 IJ SOLN
20.0000 mL | Freq: Once | INTRAMUSCULAR | Status: AC
  Administered 2015-01-07: 20 mL
  Filled 2015-01-06: qty 20

## 2015-01-06 NOTE — ED Notes (Addendum)
Pt presents with suicidal thoughts today, pt called mother and told her she wanted to driver her car into another car and kill herself.  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  Pt was at a detox facility last night but left in the night due to not feeling well.  Admits to drinking a fifth of liquor everyday, last drink was today.  Pt tearful but cooperative in triage.  Pt has abscess to left forearm that is raised and red for the past 2 days, denies drainage from site.

## 2015-01-06 NOTE — ED Notes (Addendum)
Pt changed into paper scrubs and wanded by security.  Charge and staffing aware of need for sitter.

## 2015-01-06 NOTE — ED Notes (Signed)
Sitter at bedside.

## 2015-01-06 NOTE — ED Provider Notes (Signed)
CSN: [id number]     Arrival date & time 01/06/15  2059 History   First MD Initiated Contact with Patient 01/06/15 2151     Chief Complaint  Patient presents with  . Suicidal  . Addiction Problem  . Abscess     (Consider location/radiation/quality/duration/timing/severity/associated sxs/prior Treatment) Patient is a 34 y.o. female presenting with general illness.  Illness Location:  Generalized, L knee, L forearm Quality:  Si, malaise, shakiness, aching Severity:  Moderate Onset quality:  Gradual Duration:  1 day Timing:  Constant Progression:  Worsening Chronicity:  Recurrent Context:  Chronic heroin abuse, cocaine abuse, etoh abuse Relieved by:  Nothing Worsened by:  Nothing Associated symptoms: rash (L forearm, L knee)   Associated symptoms: no abdominal pain, no chest pain, no fever, no rhinorrhea and no shortness of breath     Past Medical History  Diagnosis Date  . Hep C w/o coma, chronic   . Substance abuse    Past Surgical History  Procedure Laterality Date  . Knee arthrocentesis Left   . Tubal ligation N/A    No family history on file. History  Substance Use Topics  . Smoking status: Current Every Day Smoker  . Smokeless tobacco: Not on file  . Alcohol Use: Yes     Comment: occ   OB History    No data available     Review of Systems  Constitutional: Negative for fever.  HENT: Negative for rhinorrhea.   Respiratory: Negative for shortness of breath.   Cardiovascular: Negative for chest pain.  Gastrointestinal: Negative for abdominal pain.  Skin: Positive for rash (L forearm, L knee).  All other systems reviewed and are negative.     Allergies  Review of patient's allergies indicates no known allergies.  Home Medications   Prior to Admission medications   Medication Sig Start Date End Date Taking? Authorizing Provider  sulfamethoxazole-trimethoprim (BACTRIM DS,SEPTRA DS) 800-160 MG per tablet Take 1 tablet by mouth every 12 (twelve) hours. 5  day course started 01/04/15 01/04/15 01/09/15 Yes Historical Provider, MD  ketorolac (TORADOL) 10 MG tablet Take 1 tablet (10 mg total) by mouth every 6 (six) hours as needed. For pain Patient not taking: Reported on 01/06/2015 06/06/14   Billy Fischer, MD  naproxen (NAPROSYN) 500 MG tablet Take 1 tablet (500 mg total) by mouth 2 (two) times daily. Patient not taking: Reported on 01/06/2015 05/30/14   Etta Quill, NP  oxyCODONE (ROXICODONE) 5 MG immediate release tablet Take 1 tablet (5 mg total) by mouth every 4 (four) hours as needed for severe pain. Patient not taking: Reported on 01/06/2015 05/30/14   Etta Quill, NP  penicillin v potassium (VEETID) 500 MG tablet Take 1 tablet (500 mg total) by mouth 3 (three) times daily. Patient not taking: Reported on 01/06/2015 05/30/14   Etta Quill, NP   BP 113/66 mmHg  Pulse 84  Temp(Src) 98.2 F (36.8 C) (Oral)  Resp 16  Ht '5\' 10"'  (1.778 m)  Wt 165 lb (74.844 kg)  BMI 23.68 kg/m2  SpO2 98% Physical Exam  Constitutional: She is oriented to person, place, and time. She appears well-developed and well-nourished.  HENT:  Head: Normocephalic and atraumatic.  Right Ear: External ear normal.  Left Ear: External ear normal.  Eyes: Conjunctivae and EOM are normal. Pupils are equal, round, and reactive to light.  Neck: Normal range of motion. Neck supple.  Cardiovascular: Normal rate, regular rhythm, normal heart sounds and intact distal pulses.   Pulmonary/Chest: Effort normal and  breath sounds normal.  Abdominal: Soft. Bowel sounds are normal. There is no tenderness.  Musculoskeletal: Normal range of motion.  2 cm fluctuant abscess with surrounding erythema to L ventral forearm, also with bruising to L knee, FROM, palpable defect in lateral patella only from prior patellar fracture  Neurological: She is alert and oriented to person, place, and time.  Skin: Skin is warm and dry.  Vitals reviewed.   ED Course  Procedures (including critical care  time) Labs Review Labs Reviewed  ACETAMINOPHEN LEVEL - Abnormal; Notable for the following:    Acetaminophen (Tylenol), Serum <10.0 (*)    All other components within normal limits  COMPREHENSIVE METABOLIC PANEL - Abnormal; Notable for the following:    Glucose, Bld 123 (*)    Total Protein 8.4 (*)    AST 55 (*)    ALT 94 (*)    GFR calc non Af Amer 66 (*)    GFR calc Af Amer 77 (*)    All other components within normal limits  URINE RAPID DRUG SCREEN (HOSP PERFORMED) - Abnormal; Notable for the following:    Opiates POSITIVE (*)    Cocaine POSITIVE (*)    Benzodiazepines POSITIVE (*)    All other components within normal limits  CBC  ETHANOL  SALICYLATE LEVEL    Imaging Review Dg Knee 2 Views Left  01/07/2015   CLINICAL DATA:  Left knee pain, bruising on anterior knee. No new injury. History of surgery.  EXAM: LEFT KNEE - 1-2 VIEW  COMPARISON:  05/30/2014  FINDINGS: Chronic deformity of the lower pole of the patella, likely related to old injury. Stable appearance. No acute fracture, subluxation or dislocation. No joint effusion. No acute scratch has soft tissues are intact.  IMPRESSION: No acute bony abnormality.   Electronically Signed   By: Rolm Baptise M.D.   On: 01/07/2015 00:00     EKG Interpretation None      MDM   Final diagnoses:  Suicidal ideations  Polysubstance abuse  Abscess of left forearm  Lateral knee pain, left    34 y.o. female with pertinent PMH of chronic polypharmacy abuse presents with a myriad of complaints, including SI, request to detox from heroin, cocaine, and etoh abuse, as well as abscess to L forearm from heroin abuse and L knee pain and bruising.  She reportedly told her mother today that she was suicidal with a plan to drive her car into another car to kill herself.  States she drank a fifth of etoh this am.  Pt with labile affect on arrival, initial chief complaint on examination was that she wanted the lights off.  I attempted to allay pt  concerns, however she continued to be demanding and tangential.  I was able to accurately obtain a history however.  Physical exam as above with L forearm abscess, mild bruising over L knee with FROM and no acute pathology evident.    Wu as above.  Pt became verbally abusive, screaming and demanding clonidine.  I attempted to allay pt concerns again, however she continued to scream.  Initial examination without signs of active withdrawal from depressants: no piloerection, fasciculations, and vitals without tachycardia during initial examination.  I do not feel clonidine is medically necessary at this time.  Due to concern for a decaying therapeutic relationship between myself and the patient, I asked my PA to drain the patient's abscess.  She was placed on CIWA for ETOH withdrawal.  Psych consulted.    I have  reviewed all laboratory and imaging studies if ordered as above  1. Suicidal ideations   2. Polysubstance abuse   3. Abscess of left forearm   4. Lateral knee pain, left         Debby Freiberg, MD 01/07/15 2014

## 2015-01-07 ENCOUNTER — Encounter (HOSPITAL_COMMUNITY): Payer: Self-pay | Admitting: Emergency Medicine

## 2015-01-07 DIAGNOSIS — F1910 Other psychoactive substance abuse, uncomplicated: Secondary | ICD-10-CM | POA: Diagnosis present

## 2015-01-07 DIAGNOSIS — R45851 Suicidal ideations: Secondary | ICD-10-CM

## 2015-01-07 LAB — RAPID URINE DRUG SCREEN, HOSP PERFORMED
AMPHETAMINES: NOT DETECTED
BARBITURATES: NOT DETECTED
Benzodiazepines: POSITIVE — AB
COCAINE: POSITIVE — AB
OPIATES: POSITIVE — AB
TETRAHYDROCANNABINOL: NOT DETECTED

## 2015-01-07 MED ORDER — LORAZEPAM 1 MG PO TABS: 0.0000 mg | ORAL_TABLET | Freq: Two times a day (BID) | ORAL | Status: DC

## 2015-01-07 MED ORDER — LORAZEPAM 1 MG PO TABS
0.0000 mg | ORAL_TABLET | Freq: Four times a day (QID) | ORAL | Status: DC
  Administered 2015-01-07 – 2015-01-08 (×5): 1 mg via ORAL
  Filled 2015-01-07: qty 1
  Filled 2015-01-07: qty 2
  Filled 2015-01-07 (×3): qty 1

## 2015-01-07 MED ORDER — VITAMIN B-1 100 MG PO TABS
100.0000 mg | ORAL_TABLET | Freq: Every day | ORAL | Status: DC
  Administered 2015-01-07 – 2015-01-08 (×2): 100 mg via ORAL
  Filled 2015-01-07 (×2): qty 1

## 2015-01-07 MED ORDER — NICOTINE 21 MG/24HR TD PT24
21.0000 mg | MEDICATED_PATCH | Freq: Once | TRANSDERMAL | Status: AC
  Administered 2015-01-07: 21 mg via TRANSDERMAL
  Filled 2015-01-07: qty 1

## 2015-01-07 MED ORDER — LORAZEPAM 2 MG/ML IJ SOLN: 0.0000 mg | Freq: Four times a day (QID) | INTRAMUSCULAR | Status: DC

## 2015-01-07 MED ORDER — SULFAMETHOXAZOLE-TRIMETHOPRIM 800-160 MG PO TABS
1.0000 | ORAL_TABLET | Freq: Two times a day (BID) | ORAL | Status: DC
  Administered 2015-01-07 – 2015-01-08 (×3): 1 via ORAL
  Filled 2015-01-07 (×3): qty 1

## 2015-01-07 MED ORDER — THIAMINE HCL 100 MG/ML IJ SOLN
100.0000 mg | Freq: Every day | INTRAMUSCULAR | Status: DC
Start: 2015-01-07 — End: 2015-01-07

## 2015-01-07 MED ORDER — LORAZEPAM 2 MG/ML IJ SOLN: 0.0000 mg | Freq: Two times a day (BID) | INTRAMUSCULAR | Status: DC

## 2015-01-07 NOTE — BH Assessment (Signed)
[id number]:  Consulted with Dr. Littie DeedsGentry about the Patient.  He reports Patient is a polysubstance user of heroin, cocaine, and alcohol.  Patient left detox 2 days ago and went back to active substance use.  He reports Patient has suicidal ideations with a plan and that she reports withdrawing from alcohol.    0110:  Schedule tele-assessment  0130:  Tele-assessment completed  0134:  Consult with Extender Maryjean Mornharles Kober:  Per Maryjean Mornharles Kober Patient meets inpatient criteria seek residential placement.    0138:  Patient disposition provided to Dr. Littie DeedsGentry.

## 2015-01-07 NOTE — ED Notes (Signed)
PATIENT STATES THAT SHE WENT TO [facility] EARLIER THIS WEEK SEEKING HELP TO DETOX. STATES THAT SHE WAS SENT TO [facility] IN [place]. STATES SHE WAS THERE FOR 2 DAYS AND "I LEFT BECAUSE THE WITHDRAWALS WERE GETTING TOO BAD" . STATES SHE HAS BEEN LIVING IN [place]OTELS, STATES SHE HAS BEEN USING ABOUT A GRAM OF HEROIN DAILY, SMOKING CRACK OR USING COCAINE DAILY "A COUPLE HUNDRED DOLLARS WORTH", AND DRINKING ABOUT A FIFTH OF LIQUOR DAILY. STATES "THE WITHDRAWALS HAVE BEEN TOO HARD TO GET THRU", STATES SHE WOULD LIKE TO DETOX AND GO TO A TREATMENT PROGRAM TO QUIT USING DRUGS.

## 2015-01-07 NOTE — ED Provider Notes (Signed)
INCISION AND DRAINAGE Performed by: ZOXWR,UEAVWSOFIA,KAREN Consent: Verbal consent obtained. Risks and benefits: risks, benefits and alternatives were discussed Type: abscess  Body area: left arm  Anesthesia: local infiltration  Incision was made with a scalpel.  Local anesthetic: lidocaine 2%  Anesthetic total: 10 ml  Complexity: complex Blunt dissection to break up loculations  Drainage: purulent  Drainage amount: 2 cc  Packing material: open  Patient tolerance: Patient tolerated the procedure well with no immediate complications.    Lonia SkinnerLeslie K EchoSofia, PA-C 01/07/15 [phone]

## 2015-01-07 NOTE — ED Notes (Signed)
Telepsych in process 

## 2015-01-07 NOTE — BH Assessment (Signed)
0230:  Spoke with Gwyn at [facility]:  She reports send referral information on Patient.

## 2015-01-07 NOTE — Progress Notes (Signed)
CSW faxed referrals to [facility] and [facility] for opiate detox.  Due to expressing suicidal ideation upon admission psych is recommending we observe her 24 hrs for safety and then look for inpatient hospitalization or detox.  CSW faxed referral to [facility] to add her to wait list for tomorrow.  [facility] Petrovich Macy MisLCSW,LCAS [place] ED CSW [phone]

## 2015-01-07 NOTE — BH Assessment (Signed)
Assessment Note  Jasmine [name] is an 34 y.o. female who reports asking her Mother to drive her to [facility].  Patient presented, agitated about current meds being given for withdrawal symptoms and anxious, she reports feeling "depressed and anxious", orientated x4.  Patient denied current SI, but reports experiencing SI with a plan to overdose on drug yesterday around 1800.  She reports feeling suicidal because "I did too much heroin."  The Patient denied a history of SI ideations, plans, attempts, or gestures.  She denied experiencing psychosis when she is not actively using substances.  The Patient reports feeling depressed since 2013.  She reports continued grief over the loss of her Father from 2015, she was tearful during the assessment, feeling tired and not having any energy, sadden over losing custody of her 48 year old Son, and the vicious daily cycle of finding money to buy drugs and getting high.   The Patient reports daily use of heroin (IV 1 gram), cocaine (snort or smoke $100 daily), and alcohol use (consumes a 5th a day).  She reports last use of all substances was yesterday.  The Patient reports leaving [facility] 2 days ago because the Doctor was not prescribing the "right withdrawal medications."  The Patient reports a long history of residential substance use treatments.  She reports an inpatient mental health hospitalization at the age of 14 years for depression and SI at [facility].  The Patient reports she would like residential substance use treatment.    Axis I: Depressive Disorder NOS and Opioid Use Disorder, severe, Stimulant Use Disorder, severe, Alcohol Use Disorder, moderate Axis II: Deferred Axis IV: economic problems, housing problems, problems related to social environment and problems with primary support group Axis V: 31-40 impairment in reality testing  Past Medical History:  Past Medical History  Diagnosis Date  . Hep C w/o coma, chronic     Past Surgical History   Procedure Laterality Date  . Knee arthrocentesis Left   . Tubal ligation N/A     Family History: No family history on file.  Social History:  reports that she has been smoking.  She does not have any smokeless tobacco history on file. She reports that she drinks alcohol. She reports that she does not use illicit drugs.  Additional Social History:     CIWA: CIWA-Ar BP: 126/79 mmHg Pulse Rate: (!) 143 (Pt upset and yelling at this time) Nausea and Vomiting: no nausea and no vomiting Tactile Disturbances: very mild itching, pins and needles, burning or numbness Tremor: two Auditory Disturbances: not present Paroxysmal Sweats: two Visual Disturbances: not present Anxiety: two Headache, Fullness in Head: moderately severe Agitation: somewhat more than normal activity Orientation and Clouding of Sensorium: oriented and can do serial additions CIWA-Ar Total: 12 COWS: Clinical Opiate Withdrawal Scale (COWS) Resting Pulse Rate: Pulse Rate 81-100 Sweating: Subjective report of chills or flushing Restlessness: Frequent shifting or extraneous movements of legs/arms Pupil Size: Pupils possibly larger than normal for room light Bone or Joint Aches: Mild diffuse discomfort Runny Nose or Tearing: Nasal stuffiness or unusually moist eyes GI Upset: No GI symptoms Tremor: Slight tremor observable Yawning: No yawning Anxiety or Irritability: Patient obviously irritable/anxious Gooseflesh Skin: Piloerection of skin can be felt or hairs standing up on arms COWS Total Score: 15  Allergies: No Known Allergies  Home Medications:  (Not in a hospital admission)  OB/GYN Status:  No LMP recorded.  General Assessment Data Location of Assessment: [facility] ED ACT Assessment: Yes Is  this a Tele or Face-to-Face Assessment?: Tele Assessment Is this an Initial Assessment or a Re-assessment for this encounter?: Initial Assessment Living Arrangements: Spouse/significant other Can pt return to current  living arrangement?: Yes Admission Status: Voluntary Is patient capable of signing voluntary admission?: Yes Transfer from: Other (Comment) ([employer]) Referral Source: Self/Family/Friend  Medical Screening Exam [facility] Walk-in ONLY) Medical Exam completed: Yes  [facility] Crisis Care Plan Living Arrangements: Spouse/significant other Name of Psychiatrist: None Name of Therapist: None  Education Status Is patient currently in school?: No Current Grade: N/A Highest grade of school patient has completed: N/A Name of school: N/A Contact person: N/A  Risk to self with the past 6 months Suicidal Ideation: No-Not Currently/Within Last 6 Months Suicidal Intent: No-Not Currently/Within Last 6 Months Is patient at risk for suicide?: No Suicidal Plan?: No-Not Currently/Within Last 6 Months Access to Means: No What has been your use of drugs/alcohol within the last 12 months?: Heroin, Cocaine, Alcohol Previous Attempts/Gestures: No How many times?: 0 Other Self Harm Risks: 0 Triggers for Past Attempts: None known Intentional Self Injurious Behavior: None Family Suicide History: Unknown Recent stressful life event(s): Loss (Comment), Other (Comment) (Father death in 51 and current substance use) Persecutory voices/beliefs?: No Depression: Yes Depression Symptoms: Tearfulness, Feeling worthless/self pity, Fatigue, Insomnia Substance abuse history and/or treatment for substance abuse?: Yes (detox and residential treatment  for polysubstance use) Suicide prevention information given to non-admitted patients: Not applicable  Risk to Others within the past 6 months Homicidal Ideation: No Thoughts of Harm to Others: No Current Homicidal Intent: No Current Homicidal Plan: No Access to Homicidal Means: No Identified Victim: N/A History of harm to others?: No Assessment of Violence: None Noted Violent Behavior Description: None Does patient have access to weapons?: No Criminal Charges Pending?: No  (Patient reports being unsure if she has pending charges) Does patient have a court date: No (Patient reports being unsure)  Psychosis Hallucinations: None noted Delusions: None noted  Mental Status Report Appearance/Hygiene: In hospital gown Eye Contact: Fair Motor Activity: Agitation, Restlessness Speech: Logical/coherent Level of Consciousness: Alert Mood: Anxious, Depressed Affect: Anxious, Depressed Anxiety Level: Moderate Thought Processes: Coherent, Relevant Judgement: Impaired Orientation: Person, Time, Place, Situation Obsessive Compulsive Thoughts/Behaviors: None  Cognitive Functioning Concentration: Decreased Memory: Recent Intact, Remote Intact IQ: Average Insight: Poor Impulse Control: Poor Appetite: Fair Weight Loss: 60 (over a year period due to substance use) Weight Gain: 0 Sleep: Decreased Total Hours of Sleep: 3 (interrupted by substance cravings and urges) Vegetative Symptoms: None  ADLScreening [facility] Assessment Services) Patient's cognitive ability adequate to safely complete daily activities?: Yes Patient able to express need for assistance with ADLs?: Yes Independently performs ADLs?: Yes (appropriate for developmental age)  Prior Inpatient Therapy Prior Inpatient Therapy: Yes Prior Therapy Dates: 1996 Prior Therapy Facilty/Provider(s): [facility] Reason for Treatment: Depression and SI  Prior Outpatient Therapy Prior Outpatient Therapy: No Prior Therapy Dates: N/A Prior Therapy Facilty/Provider(s): N/A Reason for Treatment: N/A  ADL Screening (condition at time of admission) Patient's cognitive ability adequate to safely complete daily activities?: Yes Is the patient deaf or have difficulty hearing?: No Does the patient have difficulty seeing, even when wearing glasses/contacts?: No Does the patient have difficulty concentrating, remembering, or making decisions?: No Patient able to express need for assistance with ADLs?: Yes Does  the patient have difficulty dressing or bathing?: No Independently performs ADLs?: Yes (appropriate for developmental age) Does the patient have difficulty walking or climbing stairs?: No Weakness of Legs: None Weakness of Arms/Hands:  None  Home Assistive Devices/Equipment Home Assistive Devices/Equipment: None    Abuse/Neglect Assessment (Assessment to be complete while patient is alone) Physical Abuse: Denies Verbal Abuse: Denies Sexual Abuse: Denies Exploitation of patient/patient's resources: Denies Self-Neglect: Denies Values / Beliefs Cultural Requests During Hospitalization: None Spiritual Requests During Hospitalization: None   Advance Directives (For Healthcare) Does patient have an advance directive?: No Would patient like information on creating an advanced directive?: No - patient declined information    Additional Information 1:1 In Past 12 Months?: No CIRT Risk: No Elopement Risk: No Does patient have medical clearance?: Yes     Disposition:  Disposition Initial Assessment Completed for this Encounter: Yes Disposition of Patient: Inpatient treatment program (residential substance use treatment) Type of inpatient treatment program: Adult  On Site Evaluation by:   Reviewed with Physician:    Dey-Johnson,Robinson Brinkley 01/07/2015 1:54 AM

## 2015-01-07 NOTE — Consult Note (Signed)
[facility] Face-to-face Psychiatry Consultation   Reason for Consult: Opiate Abuse, Suicidal Ideation Referring Physician:  EDP Patient Identification: Jasmine [name] MRN:  [id number] Principal Diagnosis: <principal problem not specified> Diagnosis:   Patient Active Problem List   Diagnosis Date Noted  . Polysubstance abuse [F19.10]     Total Time spent with patient: 25 minutes  Subjective:   Jasmine [name] is a 34 y.o. female patient admitted with reports of opiate abuse (heroin) and suicidal ideation. Pt seen and chart reviewed. Pt reports that she is no longer suicidal but that she was "high and overwhelmed" about her drug use. Pt denies HI and AVH. She would like inpatient rehab which we can seek placement for at this time. However, it is too soon to discharge her based on the short-term change in subjective reporting about suicidal ideation. We will continue seeking placement and will re-evaluate in the AM on 03/28 also.   HPI:  Jasmine [name] is an 34 y.o. female who reports asking her Mother to drive her to [facility]. Patient presented, agitated about current meds being given for withdrawal symptoms and anxious, she reports feeling "depressed and anxious", orientated x4. Patient denied current SI, but reports experiencing SI with a plan to overdose on drug yesterday around 1800. She reports feeling suicidal because "I did too much heroin." The Patient denied a history of SI ideations, plans, attempts, or gestures. She denied experiencing psychosis when she is not actively using substances. The Patient reports feeling depressed since 2013. She reports continued grief over the loss of her Father from 2015, she was tearful during the assessment, feeling tired and not having any energy, sadden over losing custody of her 15 year old Son, and the vicious daily cycle of finding money to buy drugs and getting high. The Patient reports daily use of heroin (IV 1 gram), cocaine (snort or smoke $100  daily), and alcohol use (consumes a 5th a day). She reports last use of all substances was yesterday. The Patient reports leaving [facility] 2 days ago because the Doctor was not prescribing the "right withdrawal medications." The Patient reports a long history of residential substance use treatments. She reports an inpatient mental health hospitalization at the age of 1 years for depression and SI at [facility]. The Patient reports she would like residential substance use treatment.  HPI Elements:   Location:  Improving. Quality:  Improving. Severity:  Severe. Timing:  Constnat. Duration:  Chronic. Context:  Exacerbation of underlying substance induced mood disorder secondary to life stressors.  Past Medical History:  Past Medical History  Diagnosis Date  . Hep C w/o coma, chronic   . Substance abuse     Past Surgical History  Procedure Laterality Date  . Knee arthrocentesis Left   . Tubal ligation N/A    Family History: No family history on file. Social History:  History  Alcohol Use  . Yes    Comment: occ     History  Drug Use No    History   Social History  . Marital Status: Single    Spouse Name: N/A  . Number of Children: N/A  . Years of Education: N/A   Social History Main Topics  . Smoking status: Current Every Day Smoker  . Smokeless tobacco: Not on file  . Alcohol Use: Yes     Comment: occ  . Drug Use: No  . Sexual Activity: Not on file   Other Topics Concern  . None   Social  History Narrative   Additional Social History:                          Allergies:  No Known Allergies  Labs:  Results for orders placed or performed during the hospital encounter of 01/06/15 (from the past 48 hour(s))  Acetaminophen level     Status: Abnormal   Collection Time: 01/06/15  9:53 PM  Result Value Ref Range   Acetaminophen (Tylenol), Serum <10.0 (L) 10 - 30 ug/mL    Comment:        THERAPEUTIC CONCENTRATIONS VARY SIGNIFICANTLY. A RANGE OF  10-30 ug/mL MAY BE AN EFFECTIVE CONCENTRATION FOR MANY PATIENTS. HOWEVER, SOME ARE BEST TREATED AT CONCENTRATIONS OUTSIDE THIS RANGE. ACETAMINOPHEN CONCENTRATIONS >150 ug/mL AT 4 HOURS AFTER INGESTION AND >50 ug/mL AT 12 HOURS AFTER INGESTION ARE OFTEN ASSOCIATED WITH TOXIC REACTIONS.   CBC     Status: None   Collection Time: 01/06/15  9:53 PM  Result Value Ref Range   WBC 8.5 4.0 - 10.5 K/uL   RBC 4.97 3.87 - 5.11 MIL/uL   Hemoglobin 13.6 12.0 - 15.0 g/dL   HCT 41.8 36.0 - 46.0 %   MCV 84.1 78.0 - 100.0 fL   MCH 27.4 26.0 - 34.0 pg   MCHC 32.5 30.0 - 36.0 g/dL   RDW 15.2 11.5 - 15.5 %   Platelets 275 150 - 400 K/uL  Comprehensive metabolic panel     Status: Abnormal   Collection Time: 01/06/15  9:53 PM  Result Value Ref Range   Sodium 139 135 - 145 mmol/L   Potassium 3.5 3.5 - 5.1 mmol/L   Chloride 105 96 - 112 mmol/L   CO2 27 19 - 32 mmol/L   Glucose, Bld 123 (H) 70 - 99 mg/dL   BUN 16 6 - 23 mg/dL   Creatinine, Ser 1.07 0.50 - 1.10 mg/dL   Calcium 9.3 8.4 - 10.5 mg/dL   Total Protein 8.4 (H) 6.0 - 8.3 g/dL   Albumin 3.7 3.5 - 5.2 g/dL   AST 55 (H) 0 - 37 U/L   ALT 94 (H) 0 - 35 U/L   Alkaline Phosphatase 79 39 - 117 U/L   Total Bilirubin 0.4 0.3 - 1.2 mg/dL   GFR calc non Af Amer 66 (L) >90 mL/min   GFR calc Af Amer 77 (L) >90 mL/min    Comment: (NOTE) The eGFR has been calculated using the CKD EPI equation. This calculation has not been validated in all clinical situations. eGFR's persistently <90 mL/min signify possible Chronic Kidney Disease.    Anion gap 7 5 - 15  Ethanol (ETOH)     Status: None   Collection Time: 01/06/15  9:53 PM  Result Value Ref Range   Alcohol, Ethyl (B) <5 0 - 9 mg/dL    Comment:        LOWEST DETECTABLE LIMIT FOR SERUM ALCOHOL IS 11 mg/dL FOR MEDICAL PURPOSES ONLY   Salicylate level     Status: None   Collection Time: 01/06/15  9:53 PM  Result Value Ref Range   Salicylate Lvl <3.6 2.8 - 20.0 mg/dL  Urine Drug Screen      Status: Abnormal   Collection Time: 01/06/15 11:10 PM  Result Value Ref Range   Opiates POSITIVE (A) NONE DETECTED   Cocaine POSITIVE (A) NONE DETECTED   Benzodiazepines POSITIVE (A) NONE DETECTED   Amphetamines NONE DETECTED NONE DETECTED   Tetrahydrocannabinol NONE DETECTED NONE DETECTED  Barbiturates NONE DETECTED NONE DETECTED    Comment:        DRUG SCREEN FOR MEDICAL PURPOSES ONLY.  IF CONFIRMATION IS NEEDED FOR ANY PURPOSE, NOTIFY LAB WITHIN 5 DAYS.        LOWEST DETECTABLE LIMITS FOR URINE DRUG SCREEN Drug Class       Cutoff (ng/mL) Amphetamine      1000 Barbiturate      200 Benzodiazepine   528 Tricyclics       413 Opiates          300 Cocaine          300 THC              50     Vitals: Blood pressure 98/54, pulse 79, temperature 98.3 F (36.8 C), temperature source Oral, resp. rate 16, height _0  (1.778 m), weight 74.844 kg (165 lb), SpO2 97 %.  Risk to Self: Suicidal Ideation: No-Not Currently/Within Last 6 Months Suicidal Intent: No-Not Currently/Within Last 6 Months Is patient at risk for suicide?: No Suicidal Plan?: No-Not Currently/Within Last 6 Months Access to Means: No What has been your use of drugs/alcohol within the last 12 months?: Heroin, Cocaine, Alcohol How many times?: 0 Other Self Harm Risks: 0 Triggers for Past Attempts: None known Intentional Self Injurious Behavior: None Risk to Others: Homicidal Ideation: No Thoughts of Harm to Others: No Current Homicidal Intent: No Current Homicidal Plan: No Access to Homicidal Means: No Identified Victim: N/A History of harm to others?: No Assessment of Violence: None Noted Violent Behavior Description: None Does patient have access to weapons?: No Criminal Charges Pending?: No (Patient reports being unsure if she has pending charges) Does patient have a court date: No (Patient reports being unsure) Prior Inpatient Therapy: Prior Inpatient Therapy: Yes Prior Therapy Dates: 1996 Prior  Therapy Facilty/Provider(s): [facility] Reason for Treatment: Depression and SI Prior Outpatient Therapy: Prior Outpatient Therapy: No Prior Therapy Dates: N/A Prior Therapy Facilty/Provider(s): N/A Reason for Treatment: N/A  Current Facility-Administered Medications  Medication Dose Route Frequency Provider Last Rate Last Dose  . LORazepam (ATIVAN) tablet 0-4 mg  0-4 mg Oral 4 times per day Debby Freiberg, MD   1 mg at 01/07/15 1155   Followed by  . [START ON 01/09/2015] LORazepam (ATIVAN) tablet 0-4 mg  0-4 mg Oral Q12H Debby Freiberg, MD      . nicotine (NICODERM CQ - dosed in mg/24 hours) patch 21 mg  21 mg Transdermal Once Elnora Morrison, MD   21 mg at 01/07/15 1028  . sulfamethoxazole-trimethoprim (BACTRIM DS,SEPTRA DS) 800-160 MG per tablet 1 tablet  1 tablet Oral Q12H Everlene Balls, MD   1 tablet at 01/07/15 0949  . thiamine (VITAMIN B-1) tablet 100 mg  100 mg Oral Daily Debby Freiberg, MD   100 mg at 01/07/15 2440   Current Outpatient Prescriptions  Medication Sig Dispense Refill  . sulfamethoxazole-trimethoprim (BACTRIM DS,SEPTRA DS) 800-160 MG per tablet Take 1 tablet by mouth every 12 (twelve) hours. 5 day course started 01/04/15    . ketorolac (TORADOL) 10 MG tablet Take 1 tablet (10 mg total) by mouth every 6 (six) hours as needed. For pain (Patient not taking: Reported on 01/06/2015) 20 tablet 1  . naproxen (NAPROSYN) 500 MG tablet Take 1 tablet (500 mg total) by mouth 2 (two) times daily. (Patient not taking: Reported on 01/06/2015) 20 tablet 0  . oxyCODONE (ROXICODONE) 5 MG immediate release tablet Take 1 tablet (5 mg total) by mouth  every 4 (four) hours as needed for severe pain. (Patient not taking: Reported on 01/06/2015) 30 tablet 0  . penicillin v potassium (VEETID) 500 MG tablet Take 1 tablet (500 mg total) by mouth 3 (three) times daily. (Patient not taking: Reported on 01/06/2015) 30 tablet 0    Musculoskeletal: Strength & Muscle Tone: within normal limits Gait &  Station: normal Patient leans: N/A  Psychiatric Specialty Exam:     Blood pressure 98/54, pulse 79, temperature 98.3 F (36.8 C), temperature source Oral, resp. rate 16, height _0  (1.778 m), weight 74.844 kg (165 lb), SpO2 97 %.Body mass index is 23.68 kg/(m^2).  General Appearance: Casual and Fairly Groomed  Engineer, water::  Good  Speech:  Clear and Coherent and Normal Rate  Volume:  Normal  Mood:  Anxious and Depressed  Affect:  Depressed  Thought Process:  Coherent and Goal Directed  Orientation:  Full (Time, Place, and Person)  Thought Content:  WDL  Suicidal Thoughts:  No  Homicidal Thoughts:  No  Memory:  Immediate;   Fair Recent;   Fair Remote;   Fair  Judgement:  Fair  Insight:  Fair  Psychomotor Activity:  Normal  Concentration:  Fair  Recall:  [employer] of Knowledge:Fair  Language: Good  Akathisia:  No  Handed:    AIMS (if indicated):     Assets:  Communication Skills Desire for Improvement Resilience  ADL's:  Intact  Cognition: WNL  Sleep:      Medical Decision Making: Established Problem, Stable/Improving (1), Review of Psycho-Social Stressors (1) and Review or order clinical lab tests (1)   Treatment Plan Summary: Daily contact with patient to assess and evaluate symptoms and progress in treatment  Plan:  No evidence of imminent risk to self or others at present.   Patient does not meet criteria for psychiatric inpatient admission. Supportive therapy provided about ongoing stressors. Refer to IOP. Discussed crisis plan, support from social network, calling 911, coming to the Emergency Department, and calling Suicide Hotline.   Disposition:  -Seek inpatient placement for opiate detox -Re-evaluate tomorrow regarding suicidal ideation (pt denies but we need more objective data)  Benjamine Mola, FNP-BC 01/07/2015 11:54 AM      Case discussed with me as above  Neita Garnet MD

## 2015-01-07 NOTE — ED Notes (Addendum)
Pt is yelling, demanding medication for her withdrawal. Littie DeedsGentry, MD at bedside attempting to reason with the pt. [employer] called to bedside.

## 2015-01-08 DIAGNOSIS — F1910 Other psychoactive substance abuse, uncomplicated: Secondary | ICD-10-CM

## 2015-01-08 MED ORDER — NICOTINE 21 MG/24HR TD PT24
21.0000 mg | MEDICATED_PATCH | Freq: Every day | TRANSDERMAL | Status: DC
  Administered 2015-01-08: 21 mg via TRANSDERMAL
  Filled 2015-01-08: qty 1

## 2015-01-08 MED ORDER — HYDROXYZINE HCL 25 MG PO TABS
25.0000 mg | ORAL_TABLET | Freq: Four times a day (QID) | ORAL | Status: DC | PRN
  Administered 2015-01-08: 25 mg via ORAL
  Filled 2015-01-08: qty 1

## 2015-01-08 MED ORDER — LOPERAMIDE HCL 2 MG PO CAPS: 2.0000 mg | ORAL_CAPSULE | ORAL | Status: DC | PRN

## 2015-01-08 MED ORDER — METHOCARBAMOL 500 MG PO TABS
500.0000 mg | ORAL_TABLET | Freq: Three times a day (TID) | ORAL | Status: DC | PRN
  Administered 2015-01-08: 500 mg via ORAL
  Filled 2015-01-08: qty 1

## 2015-01-08 MED ORDER — CLONIDINE HCL 0.1 MG PO TABS: 0.1000 mg | ORAL_TABLET | Freq: Every day | ORAL | Status: DC

## 2015-01-08 MED ORDER — CLONIDINE HCL 0.1 MG PO TABS: 0.1000 mg | ORAL_TABLET | ORAL | Status: DC

## 2015-01-08 MED ORDER — DICYCLOMINE HCL 20 MG PO TABS: 20.0000 mg | ORAL_TABLET | Freq: Four times a day (QID) | ORAL | Status: DC | PRN

## 2015-01-08 MED ORDER — IBUPROFEN 400 MG PO TABS
600.0000 mg | ORAL_TABLET | Freq: Three times a day (TID) | ORAL | Status: DC | PRN
  Administered 2015-01-08: 600 mg via ORAL
  Filled 2015-01-08 (×2): qty 1

## 2015-01-08 MED ORDER — ONDANSETRON HCL 4 MG PO TABS: 4.0000 mg | ORAL_TABLET | Freq: Three times a day (TID) | ORAL | Status: DC | PRN

## 2015-01-08 MED ORDER — CLONIDINE HCL 0.1 MG PO TABS
0.1000 mg | ORAL_TABLET | Freq: Three times a day (TID) | ORAL | Status: DC
  Administered 2015-01-08: 0.1 mg via ORAL
  Filled 2015-01-08: qty 1

## 2015-01-08 NOTE — ED Notes (Signed)
PT HAS FINISHED HER TELEPSYCH ASSESSMENT

## 2015-01-08 NOTE — ED Notes (Signed)
[facility] CALLED ABOUT TRANSPORT.

## 2015-01-08 NOTE — ED Notes (Signed)
SPOKE TO Jasmine [name] AT [facility]. HE IS AWARE OF PT WOUND ON LEFT FOREARM . HE HAS ACCEPTED PATIENT.

## 2015-01-08 NOTE — ED Notes (Signed)
CALLED [facility] TO CHECK ON STATUS OF REFERRAL THAT WAS MADE YESTERDAY. DECLINED DUE TO [facility] NOT BILLING ADULT MEDICAID AND PT DOES NOT HAVE $3500.00 UP FRONT.

## 2015-01-08 NOTE — ED Provider Notes (Signed)
Work patient to be transported to [facility] for detox.  Vanetta MuldersScott Rishith Siddoway, MD 01/08/15 1311

## 2015-01-08 NOTE — ED Notes (Signed)
CALLED AND SPOKE TO Jasmine [name] TO INFORM HIM PT HAS LEFT OUR FACILITY AND IS ENROUTE TO [facility]

## 2015-01-08 NOTE — Consult Note (Signed)
[facility] Telepsychiatry Consultation   Reason for Consult: Opiate Abuse, Suicidal Ideation Referring Physician:  EDP Patient Identification: Jasmine [name] MRN:  [id number] Principal Diagnosis: Polysubstance abuse Diagnosis:   Patient Active Problem List   Diagnosis Date Noted  . Polysubstance abuse [F19.10]     Priority: High    Total Time spent with patient: 25 minutes  Subjective:   Jasmine [name] is a 34 y.o. female patient admitted with reports of opiate abuse (heroin) and suicidal ideation. Pt seen and chart reviewed. Pt reports that she is no longer suicidal. This NP evaluated pt yesterday. Pt has denied suicidal ideation yesterday and today as well. She presents as much more enthusiastic and energetic today and states that she "feels better" and 'wants to get some help" if she is able to. Social work has sent referrals for inpatient opiate rehab. However, pt does not meet committable criteria and her COWS scores are: 15, 5, 3, respectively and have shown great improvement. Therefore, pt cannot be held while awaiting placement, but can wait until this afternoon to allow social work time to receive responses from facilities in an effort to prevent herioin relapse. If pt does not have placement today by 5PM, please discharge pt with outpatient referrals and resources given as well as a bus pass if needed.   HPI:  Jasmine [name] is an 34 y.o. female who reports asking her Mother to drive her to [facility]. Patient presented, agitated about current meds being given for withdrawal symptoms and anxious, she reports feeling "depressed and anxious", orientated x4. Patient denied current SI, but reports experiencing SI with a plan to overdose on drug yesterday around 1800. She reports feeling suicidal because "I did too much heroin." The Patient denied a history of SI ideations, plans, attempts, or gestures. She denied experiencing psychosis when she is not actively using substances. The Patient  reports feeling depressed since 2013. She reports continued grief over the loss of her Father from 2015, she was tearful during the assessment, feeling tired and not having any energy, sadden over losing custody of her 84 year old Son, and the vicious daily cycle of finding money to buy drugs and getting high. The Patient reports daily use of heroin (IV 1 gram), cocaine (snort or smoke $100 daily), and alcohol use (consumes a 5th a day). She reports last use of all substances was yesterday. The Patient reports leaving [facility] 2 days ago because the Doctor was not prescribing the "right withdrawal medications." The Patient reports a long history of residential substance use treatments. She reports an inpatient mental health hospitalization at the age of 64 years for depression and SI at [facility]. The Patient reports she would like residential substance use treatment.  HPI Elements:   Location:  Improving. Quality:  Improving. Severity:  Severe. Timing:  Constnat. Duration:  Chronic. Context:  Exacerbation of underlying substance induced mood disorder secondary to life stressors.  Past Medical History:  Past Medical History  Diagnosis Date  . Hep C w/o coma, chronic   . Substance abuse     Past Surgical History  Procedure Laterality Date  . Knee arthrocentesis Left   . Tubal ligation N/A    Family History: No family history on file. Social History:  History  Alcohol Use  . Yes    Comment: occ     History  Drug Use No    History   Social History  . Marital Status: Single    Spouse Name: N/A  .  Number of Children: N/A  . Years of Education: N/A   Social History Main Topics  . Smoking status: Current Every Day Smoker  . Smokeless tobacco: Not on file  . Alcohol Use: Yes     Comment: occ  . Drug Use: No  . Sexual Activity: Not on file   Other Topics Concern  . None   Social History Narrative   Additional Social History:                           Allergies:  No Known Allergies  Labs:  Results for orders placed or performed during the hospital encounter of 01/06/15 (from the past 48 hour(s))  Acetaminophen level     Status: Abnormal   Collection Time: 01/06/15  9:53 PM  Result Value Ref Range   Acetaminophen (Tylenol), Serum <10.0 (L) 10 - 30 ug/mL    Comment:        THERAPEUTIC CONCENTRATIONS VARY SIGNIFICANTLY. A RANGE OF 10-30 ug/mL MAY BE AN EFFECTIVE CONCENTRATION FOR MANY PATIENTS. HOWEVER, SOME ARE BEST TREATED AT CONCENTRATIONS OUTSIDE THIS RANGE. ACETAMINOPHEN CONCENTRATIONS >150 ug/mL AT 4 HOURS AFTER INGESTION AND >50 ug/mL AT 12 HOURS AFTER INGESTION ARE OFTEN ASSOCIATED WITH TOXIC REACTIONS.   CBC     Status: None   Collection Time: 01/06/15  9:53 PM  Result Value Ref Range   WBC 8.5 4.0 - 10.5 K/uL   RBC 4.97 3.87 - 5.11 MIL/uL   Hemoglobin 13.6 12.0 - 15.0 g/dL   HCT 41.8 36.0 - 46.0 %   MCV 84.1 78.0 - 100.0 fL   MCH 27.4 26.0 - 34.0 pg   MCHC 32.5 30.0 - 36.0 g/dL   RDW 15.2 11.5 - 15.5 %   Platelets 275 150 - 400 K/uL  Comprehensive metabolic panel     Status: Abnormal   Collection Time: 01/06/15  9:53 PM  Result Value Ref Range   Sodium 139 135 - 145 mmol/L   Potassium 3.5 3.5 - 5.1 mmol/L   Chloride 105 96 - 112 mmol/L   CO2 27 19 - 32 mmol/L   Glucose, Bld 123 (H) 70 - 99 mg/dL   BUN 16 6 - 23 mg/dL   Creatinine, Ser 1.07 0.50 - 1.10 mg/dL   Calcium 9.3 8.4 - 10.5 mg/dL   Total Protein 8.4 (H) 6.0 - 8.3 g/dL   Albumin 3.7 3.5 - 5.2 g/dL   AST 55 (H) 0 - 37 U/L   ALT 94 (H) 0 - 35 U/L   Alkaline Phosphatase 79 39 - 117 U/L   Total Bilirubin 0.4 0.3 - 1.2 mg/dL   GFR calc non Af Amer 66 (L) >90 mL/min   GFR calc Af Amer 77 (L) >90 mL/min    Comment: (NOTE) The eGFR has been calculated using the CKD EPI equation. This calculation has not been validated in all clinical situations. eGFR's persistently <90 mL/min signify possible Chronic Kidney Disease.    Anion gap 7 5 - 15   Ethanol (ETOH)     Status: None   Collection Time: 01/06/15  9:53 PM  Result Value Ref Range   Alcohol, Ethyl (B) <5 0 - 9 mg/dL    Comment:        LOWEST DETECTABLE LIMIT FOR SERUM ALCOHOL IS 11 mg/dL FOR MEDICAL PURPOSES ONLY   Salicylate level     Status: None   Collection Time: 01/06/15  9:53 PM  Result Value Ref Range  Salicylate Lvl <0.3 2.8 - 20.0 mg/dL  Urine Drug Screen     Status: Abnormal   Collection Time: 01/06/15 11:10 PM  Result Value Ref Range   Opiates POSITIVE (A) NONE DETECTED   Cocaine POSITIVE (A) NONE DETECTED   Benzodiazepines POSITIVE (A) NONE DETECTED   Amphetamines NONE DETECTED NONE DETECTED   Tetrahydrocannabinol NONE DETECTED NONE DETECTED   Barbiturates NONE DETECTED NONE DETECTED    Comment:        DRUG SCREEN FOR MEDICAL PURPOSES ONLY.  IF CONFIRMATION IS NEEDED FOR ANY PURPOSE, NOTIFY LAB WITHIN 5 DAYS.        LOWEST DETECTABLE LIMITS FOR URINE DRUG SCREEN Drug Class       Cutoff (ng/mL) Amphetamine      1000 Barbiturate      200 Benzodiazepine   212 Tricyclics       248 Opiates          300 Cocaine          300 THC              50     Vitals: Blood pressure 109/70, pulse 85, temperature 97.8 F (36.6 C), temperature source Oral, resp. rate 18, height '5\' 10"'  (1.778 m), weight 74.844 kg (165 lb), SpO2 98 %.  Risk to Self: Suicidal Ideation: No-Not Currently/Within Last 6 Months Suicidal Intent: No-Not Currently/Within Last 6 Months Is patient at risk for suicide?: No Suicidal Plan?: No-Not Currently/Within Last 6 Months Access to Means: No What has been your use of drugs/alcohol within the last 12 months?: Heroin, Cocaine, Alcohol How many times?: 0 Other Self Harm Risks: 0 Triggers for Past Attempts: None known Intentional Self Injurious Behavior: None Risk to Others: Homicidal Ideation: No Thoughts of Harm to Others: No Current Homicidal Intent: No Current Homicidal Plan: No Access to Homicidal Means: No Identified  Victim: N/A History of harm to others?: No Assessment of Violence: None Noted Violent Behavior Description: None Does patient have access to weapons?: No Criminal Charges Pending?: No (Patient reports being unsure if she has pending charges) Does patient have a court date: No (Patient reports being unsure) Prior Inpatient Therapy: Prior Inpatient Therapy: Yes Prior Therapy Dates: 1996 Prior Therapy Facilty/Provider(s): [facility] Reason for Treatment: Depression and SI Prior Outpatient Therapy: Prior Outpatient Therapy: No Prior Therapy Dates: N/A Prior Therapy Facilty/Provider(s): N/A Reason for Treatment: N/A  Current Facility-Administered Medications  Medication Dose Route Frequency Provider Last Rate Last Dose  . cloNIDine (CATAPRES) tablet 0.1 mg  0.1 mg Oral TID Fredia Sorrow, MD   0.1 mg at 01/08/15 1010   Followed by  . [START ON 01/10/2015] cloNIDine (CATAPRES) tablet 0.1 mg  0.1 mg Oral BH-qamhs Fredia Sorrow, MD       Followed by  . [START ON 01/13/2015] cloNIDine (CATAPRES) tablet 0.1 mg  0.1 mg Oral QAC breakfast Fredia Sorrow, MD      . dicyclomine (BENTYL) tablet 20 mg  20 mg Oral Q6H PRN Fredia Sorrow, MD      . hydrOXYzine (ATARAX/VISTARIL) tablet 25 mg  25 mg Oral Q6H PRN Fredia Sorrow, MD      . ibuprofen (ADVIL,MOTRIN) tablet 600 mg  600 mg Oral Q8H PRN Fredia Sorrow, MD   600 mg at 01/08/15 1009  . loperamide (IMODIUM) capsule 2-4 mg  2-4 mg Oral PRN Fredia Sorrow, MD      . LORazepam (ATIVAN) tablet 0-4 mg  0-4 mg Oral 4 times per day Debby Freiberg, MD  1 mg at 01/08/15 8416   Followed by  . [START ON 01/09/2015] LORazepam (ATIVAN) tablet 0-4 mg  0-4 mg Oral Q12H Debby Freiberg, MD      . methocarbamol (ROBAXIN) tablet 500 mg  500 mg Oral Q8H PRN Fredia Sorrow, MD   500 mg at 01/08/15 1009  . nicotine (NICODERM CQ - dosed in mg/24 hours) patch 21 mg  21 mg Transdermal Daily Fredia Sorrow, MD   21 mg at 01/08/15 1010  . ondansetron (ZOFRAN)  tablet 4 mg  4 mg Oral Q8H PRN Fredia Sorrow, MD      . sulfamethoxazole-trimethoprim (BACTRIM DS,SEPTRA DS) 800-160 MG per tablet 1 tablet  1 tablet Oral Q12H Everlene Balls, MD   1 tablet at 01/08/15 1009  . thiamine (VITAMIN B-1) tablet 100 mg  100 mg Oral Daily Debby Freiberg, MD   100 mg at 01/08/15 1009   Current Outpatient Prescriptions  Medication Sig Dispense Refill  . sulfamethoxazole-trimethoprim (BACTRIM DS,SEPTRA DS) 800-160 MG per tablet Take 1 tablet by mouth every 12 (twelve) hours. 5 day course started 01/04/15    . ketorolac (TORADOL) 10 MG tablet Take 1 tablet (10 mg total) by mouth every 6 (six) hours as needed. For pain (Patient not taking: Reported on 01/06/2015) 20 tablet 1  . naproxen (NAPROSYN) 500 MG tablet Take 1 tablet (500 mg total) by mouth 2 (two) times daily. (Patient not taking: Reported on 01/06/2015) 20 tablet 0  . oxyCODONE (ROXICODONE) 5 MG immediate release tablet Take 1 tablet (5 mg total) by mouth every 4 (four) hours as needed for severe pain. (Patient not taking: Reported on 01/06/2015) 30 tablet 0  . penicillin v potassium (VEETID) 500 MG tablet Take 1 tablet (500 mg total) by mouth 3 (three) times daily. (Patient not taking: Reported on 01/06/2015) 30 tablet 0    Musculoskeletal: Strength & Muscle Tone: within normal limits Gait & Station: normal Patient leans: N/A  Psychiatric Specialty Exam:     Blood pressure 109/70, pulse 85, temperature 97.8 F (36.6 C), temperature source Oral, resp. rate 18, height '5\' 10"'  (1.778 m), weight 74.844 kg (165 lb), SpO2 98 %.Body mass index is 23.68 kg/(m^2).  General Appearance: Casual and Fairly Groomed  Engineer, water::  Good  Speech:  Clear and Coherent and Normal Rate  Volume:  Normal  Mood:  Anxious and Depressed  Affect:  Depressed  Thought Process:  Coherent and Goal Directed  Orientation:  Full (Time, Place, and Person)  Thought Content:  WDL  Suicidal Thoughts:  No  Homicidal Thoughts:  No  Memory:   Immediate;   Fair Recent;   Fair Remote;   Fair  Judgement:  Fair  Insight:  Fair  Psychomotor Activity:  Normal  Concentration:  Fair  Recall:  [employer] of Knowledge:Fair  Language: Good  Akathisia:  No  Handed:    AIMS (if indicated):     Assets:  Communication Skills Desire for Improvement Resilience  ADL's:  Intact  Cognition: WNL  Sleep:      Medical Decision Making: Established Problem, Stable/Improving (1), Review of Psycho-Social Stressors (1) and Review or order clinical lab tests (1)   Treatment Plan Summary: Daily contact with patient to assess and evaluate symptoms and progress in treatment  Plan:  No evidence of imminent risk to self or others at present.   Patient does not meet criteria for psychiatric inpatient admission. Supportive therapy provided about ongoing stressors. Refer to IOP. Discussed crisis plan, support from social  network, calling 911, coming to the Emergency Department, and calling Suicide Hotline.   Disposition:  -Send pt to secured placement [facility] reports [place]) -Provide bus pass if needed   Benjamine Mola, FNP-BC 01/08/2015 10:37 AM      Case discussed with me as above Neita Garnet, MD

## 2015-01-08 NOTE — ED Notes (Signed)
CALLED [facility] AND LEFT MESSAGE. REFERRAL WAS SENT YESTERDAY

## 2015-01-08 NOTE — ED Notes (Addendum)
RECEIVED CALL BACK FROM [facility] AND THEY ARE UNABLE TO ACCEPT PATIENT DUE TO HER HAVING ADULT MEDICAID AND BEING FROM [place], SHE IS OUT OF THEIR ACCEPANCE/SERVICE AREA.

## 2015-01-08 NOTE — ED Notes (Signed)
ALL BELONGINGS SENT WITH PATIENT TO [facility]

## 2015-01-08 NOTE — ED Notes (Signed)
PATIENT INFORMED THAT WE HAVE FOUND A DETOX PROGRAM FOR HER AT [facility]. PATIENT STATES "I REALLY DONT WANT TO GO THERE". EXPLAINED TO PATIENT THAT WE HAVE TRIED ALL AVENUES TO FIND HER A DETOX PROGRAM AND THIS PROGRAM HAS BEDS. PT GIVEN CHOICE TO GO TO THIS PROGRAM OR TO HAVE OUTPATIENT RESOURCES. PT IS AGREEABLE TO GO.

## 2015-01-08 NOTE — ED Notes (Signed)
Patient is up and around. She is complaining about the temperature of her room and that breakfast has not been delivered. i have turned all thermostats down and called down to the service response to check on meals.

## 2015-01-10 ENCOUNTER — Emergency Department: Admit: 2015-01-10 | Disposition: A | Discharge status: Home / Self Care | Payer: Self-pay | Admitting: Internal Medicine

## 2015-01-10 LAB — CBC
HCT: 40.7 % (ref 35.0–47.0)
HGB: 13.4 g/dL (ref 12.0–16.0)
MCH: 27 pg (ref 26.0–34.0)
MCHC: 33 g/dL (ref 32.0–36.0)
MCV: 82 fL (ref 80–100)
PLATELETS: 246 10*3/uL (ref 150–440)
RBC: 4.97 10*6/uL (ref 3.80–5.20)
RDW: 14.7 % — AB (ref 11.5–14.5)
WBC: 9.1 10*3/uL (ref 3.6–11.0)

## 2015-01-10 LAB — COMPREHENSIVE METABOLIC PANEL
ALBUMIN: 4 g/dL
ALK PHOS: 72 U/L
ALT: 107 U/L — AB
ANION GAP: 9 (ref 7–16)
BUN: 15 mg/dL
Bilirubin,Total: 0.4 mg/dL
CALCIUM: 9.4 mg/dL
CO2: 24 mmol/L
Chloride: 103 mmol/L
Creatinine: 0.85 mg/dL
EGFR (African American): >60
EGFR (Non-African Amer.): >60
Glucose: 100 mg/dL — ABNORMAL HIGH
POTASSIUM: 3.8 mmol/L
SGOT(AST): 60 U/L — ABNORMAL HIGH
SODIUM: 136 mmol/L
Total Protein: 8.7 g/dL — ABNORMAL HIGH

## 2015-01-10 LAB — URINALYSIS, COMPLETE
BACTERIA: NONE SEEN
BLOOD: NEGATIVE
Bilirubin,UR: NEGATIVE
Glucose,UR: NEGATIVE mg/dL (ref 0–75)
Ketone: NEGATIVE
Nitrite: NEGATIVE
PH: 6 (ref 4.5–8.0)
Protein: NEGATIVE
RBC,UR: 2 /HPF (ref 0–5)
Specific Gravity: 1.01 (ref 1.003–1.030)
WBC UR: 12 /HPF (ref 0–5)

## 2015-01-10 LAB — DRUG SCREEN, URINE
Amphetamines, Ur Screen: NEGATIVE
BENZODIAZEPINE, UR SCRN: NEGATIVE
Barbiturates, Ur Screen: NEGATIVE
Cannabinoid 50 Ng, Ur ~~LOC~~: NEGATIVE
Cocaine Metabolite,Ur ~~LOC~~: NEGATIVE
MDMA (Ecstasy)Ur Screen: NEGATIVE
Methadone, Ur Screen: NEGATIVE
Opiate, Ur Screen: NEGATIVE
PHENCYCLIDINE (PCP) UR S: NEGATIVE
Tricyclic, Ur Screen: NEGATIVE

## 2015-01-10 LAB — SALICYLATE LEVEL

## 2015-01-10 LAB — ETHANOL: Ethanol: <5 mg/dL

## 2015-01-10 LAB — ACETAMINOPHEN LEVEL: Acetaminophen: <10 ug/mL

## 2015-02-03 NOTE — Consult Note (Signed)
PATIENT NAME:  Jasmine [name], Jasmine [name] MR#:  [id number] DATE OF BIRTH:  1981/09/25  DATE OF CONSULTATION:  03/12/2014  REFERRING PHYSICIAN:   CONSULTING PHYSICIAN:  Cheronda Erck K. Kolyn Rozario, MD  AGE:  33 years.  SEX:  Female.   RACE:  White.  SUBJECTIVE:  Patient was seen in consultation in the [facility]  Emergency Room - BHU 3.  [name] and last worked more than 3 weeks ago and went through [name] for op[facility] dependence and she completed the program successfully and was last discharged the Friday before, that is 10 days ago.  Patient lives by herself in an apartment.  Patient reports that she had been discharged on Suboxone 8 mg 1 SL daily and she is being followed at [facility] [place].    PAST PSYCHIATRIC HISTORY:  No previous history of inpatient hold on psychiatry.  No suicidal attempt.  Not being followed by [name] psychiatrist.   ALCOHOL AND DRUGS:  Denies drinking alcohol except on occasion.  Denies any THC or cocaine abuse.  Does admit to abusing opiates and she went to [facility] and she got help and currently she is stable and uses Suboxone of 8 mg SL per day.  Patient reports that she was brought to [facility] Emergency Room after she had arguments with her mother and was upset with her and she expressed suicidal thoughts which she did not mean.  CHIEF COMPLAINT:  "I just said that to upset my mother and get her attention but did not mean."  MENTAL STATUS:  Patient is alert and oriented to place, person, and time.  Calm, pleasant and cooperative, no agitation.  Affect is neutral.  Mood stable.  Denies feeling depressed.  Denies feeling hopeless or helpless.  Does admit to stating that she was suicidal to her mother because she had an argument with her and was upset at her mother but currently she is safe and she does not feel like hurting herself or others and is eager to be discharged and go back to her substance abuse program which is Suboxone  at [facility] [place].  No psychosis.  Denies auditory, visual hallucinations.  Denies hearing voices.  Denies paranoid or suspicious ideas.  Memory is intact.  Cognition is intact.  General knowledge of information is fair.  Denies suicidal or homicidal plan and contracts for safety.  Insight and judgment fair.  IMPRESSION: Opiate dependence, and currently, she is on Suboxone and is stable and in remission.  Impulse control disorder. Patient stated to her mother that she was suicidal which she did not mean.  PLAN:  Discontinue IVC and discharge patient to go back home as she contracts for safety.  She will go back to [facility] [place] for [place] and to get her Suboxone.    ____________________________ Jannet MantisSurya K. Guss Bunde[place]alla, MD skc:dd D: 03/12/2014 20:01:21 ET T: 03/12/2014 21:15:44 ET JOB#: [id number]  cc: Monika SalkSurya K. Guss [place]unde[place], MD, <Dictator> Beau FannySURYA K Lerline Valdivia MD ELECTRONICALLY SIGNED 03/14/2014 7:45

## 2015-02-23 ENCOUNTER — Encounter: Payer: Self-pay | Admitting: Emergency Medicine

## 2015-02-23 ENCOUNTER — Emergency Department
Admission: EM | Admit: 2015-02-23 | Discharge: 2015-02-24 | Disposition: A | Discharge status: Home / Self Care | Payer: Medicaid Other | Attending: Emergency Medicine | Admitting: Emergency Medicine

## 2015-02-23 DIAGNOSIS — R45851 Suicidal ideations: Secondary | ICD-10-CM | POA: Diagnosis present

## 2015-02-23 DIAGNOSIS — Z720 Tobacco use: Secondary | ICD-10-CM | POA: Insufficient documentation

## 2015-02-23 DIAGNOSIS — F1910 Other psychoactive substance abuse, uncomplicated: Secondary | ICD-10-CM

## 2015-02-23 DIAGNOSIS — F11221 Opioid dependence with intoxication delirium: Secondary | ICD-10-CM | POA: Insufficient documentation

## 2015-02-23 DIAGNOSIS — F1310 Sedative, hypnotic or anxiolytic abuse, uncomplicated: Secondary | ICD-10-CM | POA: Diagnosis not present

## 2015-02-23 DIAGNOSIS — F1120 Opioid dependence, uncomplicated: Secondary | ICD-10-CM | POA: Diagnosis present

## 2015-02-23 LAB — CBC
HCT: 42.6 % (ref 35.0–47.0)
Hemoglobin: 14 g/dL (ref 12.0–16.0)
MCH: 26.6 pg (ref 26.0–34.0)
MCHC: 32.9 g/dL (ref 32.0–36.0)
MCV: 80.8 fL (ref 80.0–100.0)
Platelets: 209 10*3/uL (ref 150–440)
RBC: 5.28 MIL/uL — AB (ref 3.80–5.20)
RDW: 16 % — ABNORMAL HIGH (ref 11.5–14.5)
WBC: 9.2 10*3/uL (ref 3.6–11.0)

## 2015-02-23 LAB — URINE DRUG SCREEN, QUALITATIVE (ARMC ONLY)
Amphetamines, Ur Screen: NOT DETECTED
BARBITURATES, UR SCREEN: NOT DETECTED
BENZODIAZEPINE, UR SCRN: POSITIVE — AB
COCAINE METABOLITE, UR ~~LOC~~: NOT DETECTED
Cannabinoid 50 Ng, Ur ~~LOC~~: NOT DETECTED
MDMA (Ecstasy)Ur Screen: NOT DETECTED
Methadone Scn, Ur: NOT DETECTED
Opiate, Ur Screen: NOT DETECTED
PHENCYCLIDINE (PCP) UR S: NOT DETECTED
TRICYCLIC, UR SCREEN: NOT DETECTED

## 2015-02-23 LAB — COMPREHENSIVE METABOLIC PANEL
ALBUMIN: 4.3 g/dL (ref 3.5–5.0)
ALK PHOS: 89 U/L (ref 38–126)
ALT: 207 U/L — ABNORMAL HIGH (ref 14–54)
AST: 110 U/L — ABNORMAL HIGH (ref 15–41)
Anion gap: 7 (ref 5–15)
BUN: 14 mg/dL (ref 6–20)
CO2: 25 mmol/L (ref 22–32)
Calcium: 9.3 mg/dL (ref 8.9–10.3)
Chloride: 105 mmol/L (ref 101–111)
Creatinine, Ser: 0.67 mg/dL (ref 0.44–1.00)
Glucose, Bld: 91 mg/dL (ref 65–99)
POTASSIUM: 4.1 mmol/L (ref 3.5–5.1)
SODIUM: 137 mmol/L (ref 135–145)
Total Bilirubin: 0.6 mg/dL (ref 0.3–1.2)
Total Protein: 8.5 g/dL — ABNORMAL HIGH (ref 6.5–8.1)

## 2015-02-23 LAB — URINALYSIS COMPLETE WITH MICROSCOPIC (ARMC ONLY)
Bacteria, UA: NONE SEEN
Bilirubin Urine: NEGATIVE
Glucose, UA: NEGATIVE mg/dL
HGB URINE DIPSTICK: NEGATIVE
Ketones, ur: NEGATIVE mg/dL
Nitrite: NEGATIVE
PH: 6 (ref 5.0–8.0)
Protein, ur: NEGATIVE mg/dL
SPECIFIC GRAVITY, URINE: 1.013 (ref 1.005–1.030)

## 2015-02-23 LAB — SALICYLATE LEVEL: Salicylate Lvl: <4 mg/dL (ref 2.8–30.0)

## 2015-02-23 LAB — ACETAMINOPHEN LEVEL: Acetaminophen (Tylenol), Serum: <10 ug/mL — ABNORMAL LOW (ref 10–30)

## 2015-02-23 LAB — ETHANOL

## 2015-02-23 NOTE — [facility] Counselor (Signed)
Intake paper work singed and placed on pt. Paper chart.

## 2015-02-23 NOTE — BH Assessment (Addendum)
Assessment Note  Jasmine [name] is an 34 y.o. female Who presents to the ER due to voicing SI with a plan. Pt. states he told her mother she wanted to slit her wrist and her throat. Pt. has history of substance abuse use but reports she is currently clean and sober. When asked about the length of time she's been clean, she didn't give a time frame. She stated, "I'm clean every day." During the interview, the pt. was lethargic and sluggish. She was cooperative and pleasant. Writer had to wake her up several times to gather the information.  At time of interview, UDS weren't submitted   Axis I: Major Depression, Recurrent severe and Substance Abuse Axis III:  Past Medical History  Diagnosis Date  . Hep C w/o coma, chronic   . Substance abuse    Axis IV: economic problems, housing problems, other psychosocial or environmental problems, problems related to social environment and problems with primary support group  Past Medical History:  Past Medical History  Diagnosis Date  . Hep C w/o coma, chronic   . Substance abuse     Past Surgical History  Procedure Laterality Date  . Knee arthrocentesis Left   . Tubal ligation N/A     Family History: No family history on file.  Social History:  reports that she has been smoking.  She does not have any smokeless tobacco history on file. She reports that she drinks alcohol. She reports that she uses illicit drugs (IV).  Additional Social History:  Alcohol / Drug Use Pain Medications: None Reported Prescriptions: None Reported Over the Counter: None Reported History of alcohol / drug use?: Yes Longest period of sobriety (when/how long): Unknown at this time Negative Consequences of Use: Legal, Financial, Personal relationships, Work / School Withdrawal Symptoms:  (None Reported) Substance #1 Name of Substance 1: Heroin 1 - Age of First Use: Unkown 1 - Amount (size/oz): Unknown 1 - Frequency: Daily 1 - Duration: Years Unknown 1 - Last  Use / Amount: 3 months ago  CIWA: CIWA-Ar BP: 117/74 mmHg Pulse Rate: 86 COWS:    Allergies: No Known Allergies  Home Medications:  (Not in a hospital admission)  OB/GYN Status:  No LMP recorded (within months).  General Assessment Data [facility] TTS Assessment: In system Is this a Tele or Face-to-Face Assessment?: Face-to-Face Is this an Initial Assessment or a Re-assessment for this encounter?: Initial Assessment Marital status: Single Is patient pregnant?: No Pregnancy Status: No Living Arrangements: Spouse/significant other Can pt return to current living arrangement?: Yes Admission Status: Involuntary Is patient capable of signing voluntary admission?: No Referral Source: Self/Family/Friend Insurance type: Medicaid  Medical Screening Exam [facility] Walk-in ONLY) Medical Exam completed: Yes  Crisis Care Plan Living Arrangements: Spouse/significant other Name of Psychiatrist: None Name of Therapist: None  Education Status Is patient currently in school?: No Highest grade of school patient has completed: N/A Name of school: N/A Contact person: N/A  Risk to self with the past 6 months Suicidal Ideation: Yes-Currently Present Has patient been a risk to self within the past 6 months prior to admission? : Yes Suicidal Intent: Yes-Currently Present Has patient had any suicidal intent within the past 6 months prior to admission? : No Is patient at risk for suicide?: Yes Suicidal Plan?: Yes-Currently Present Has patient had any suicidal plan within the past 6 months prior to admission? : Yes Specify Current Suicidal Plan: Slitting wrist and throat Access to Means: Yes Specify Access to  Suicidal Means: Pt. is able to get a knife What has been your use of drugs/alcohol within the last 12 months?: Heroin and Alcohol Previous Attempts/Gestures: No How many times?: 0 Other Self Harm Risks: 0 Triggers for Past Attempts: Family contact,  Unpredictable Intentional Self Injurious Behavior: None Family Suicide History: Unknown Recent stressful life event(s): Other (Comment), Loss (Comment) (Drug use, relapse ) Persecutory voices/beliefs?: No Depression: Yes Depression Symptoms: Despondent, Isolating, Fatigue, Guilt, Feeling angry/irritable, Feeling worthless/self pity Substance abuse history and/or treatment for substance abuse?: Yes Suicide prevention information given to non-admitted patients: Not applicable  Risk to Others within the past 6 months Homicidal Ideation: No Does patient have any lifetime risk of violence toward others beyond the six months prior to admission? : No Thoughts of Harm to Others: No Current Homicidal Intent: No Current Homicidal Plan: No Access to Homicidal Means: No Identified Victim: None reported History of harm to others?: No Assessment of Violence: None Noted Violent Behavior Description: None reported Does patient have access to weapons?: No Criminal Charges Pending?: No Does patient have a court date: No Is patient on probation?: No  Psychosis Hallucinations: None noted Delusions: None noted  Mental Status Report Appearance/Hygiene: Unremarkable, In hospital gown Eye Contact: Poor Motor Activity: Freedom of movement, Unremarkable Speech: Logical/coherent, Soft Level of Consciousness: Drowsy Mood: Depressed, Anxious Affect: Appropriate to circumstance Anxiety Level: Minimal Thought Processes: Coherent, Relevant Judgement: Impaired Orientation: Person, Place, Time, Situation, Appropriate for developmental age Obsessive Compulsive Thoughts/Behaviors: None  Cognitive Functioning Memory: Recent Intact, Remote Intact IQ: Average Insight: Poor Impulse Control: Poor Appetite: Fair Weight Loss: 0 Weight Gain: 0 Sleep: Decreased Total Hours of Sleep: 5 Vegetative Symptoms: None  ADLScreening [facility] Assessment Services) Patient's cognitive ability adequate to safely complete  daily activities?: Yes Patient able to express need for assistance with ADLs?: Yes Independently performs ADLs?: Yes (appropriate for developmental age)  Prior Inpatient Therapy Prior Inpatient Therapy: Yes Prior Therapy Dates: 2016 [facility] Reason for Treatment: Depression, SI and substance use  Prior Outpatient Therapy Prior Outpatient Therapy: No Prior Therapy Dates: N/A Prior Therapy Facilty/Provider(s): N/A Reason for Treatment: N/A Does patient have an ACCT team?: No Does patient have Intensive In-House Services?  : No Does patient have [facility] services? : No Does patient have P4CC services?: No  ADL Screening (condition at time of admission) Patient's cognitive ability adequate to safely complete daily activities?: Yes Patient able to express need for assistance with ADLs?: Yes Independently performs ADLs?: Yes (appropriate for developmental age)       Abuse/Neglect Assessment (Assessment to be complete while patient is alone) Physical Abuse: Denies Verbal Abuse: Denies Sexual Abuse: Denies Exploitation of patient/patient's resources: Denies Self-Neglect: Denies Values / Beliefs Cultural Requests During Hospitalization: None Spiritual Requests During Hospitalization: None Consults Spiritual Care Consult Needed: No Social Work Consult Needed: No      Additional Information 1:1 In Past 12 Months?: No CIRT Risk: No Elopement Risk: No Does patient have medical clearance?: Yes  Child/Adolescent Assessment Running Away Risk: Denies (Pt. is an adult)  Disposition:  Disposition Initial Assessment Completed for this Encounter: Yes Disposition of Patient: Inpatient treatment program Type of inpatient treatment program: Adult  On Site Evaluation by:   Reviewed with Physician:    Lilyan Gilfordalvin J. Daziyah Cogan, MS, LCAS, LPC, NCC, CCSI 02/23/2015 6:05 PM

## 2015-02-23 NOTE — ED Notes (Addendum)
[place]:  [phone] [place] [employer] to pick patient up and take her back to her car at discharge.)-Corporal Suzie PortelaPayne

## 2015-02-23 NOTE — ED Notes (Signed)
BEHAVIORAL HEALTH ROUNDING Patient sleeping: No. Patient alert and oriented: yes Behavior appropriate: Yes.  ; If no, describe:  Nutrition and fluids offered: Yes  Toileting and hygiene offered: Yes  Sitter present: no Law enforcement present: Yes  

## 2015-02-23 NOTE — ED Notes (Signed)
MD at bedside.  Psychiatrist at patient's bedside to attempt to assess patient.  Patient is quiet and appears tired.  Will continue to monitor.

## 2015-02-23 NOTE — ED Notes (Signed)
BEHAVIORAL HEALTH ROUNDING Patient sleeping: Yes.   Patient alert and oriented: not applicable Behavior appropriate: Yes.  ; If no, describe: sleeping Nutrition and fluids offered: No Toileting and hygiene offered: Yes  Sitter present: yes Law enforcement present: Yes

## 2015-02-23 NOTE — ED Notes (Signed)
ED BHU PLACEMENT JUSTIFICATION Is the patient under IVC or is there intent for IVC: Yes.   Is the patient medically cleared: Yes.   Is there vacancy in the ED BHU: Yes.   Is the population mix appropriate for patient: Yes.   Is the patient awaiting placement in inpatient or outpatient setting: Yes.   Has the patient had a psychiatric consult: Yes.   Survey of unit performed for contraband, proper placement and condition of furniture, tampering with fixtures in bathroom, shower, and each patient room: Yes.  ; Findings:  APPEARANCE/BEHAVIOR calm and cooperative NEURO ASSESSMENT Orientation: time, place and person Hallucinations: No.None noted (Hallucinations) Speech: Normal Gait: normal RESPIRATORY ASSESSMENT No distress CARDIOVASCULAR ASSESSMENT No distress GASTROINTESTINAL ASSESSMENT No distress EXTREMITIES no deformities PLAN OF CARE Provide calm/safe environment. Vital signs assessed twice daily. ED BHU Assessment once each 12-hour shift. Collaborate with intake RN daily or as condition indicates. Assure the ED provider has rounded once each shift. Provide and encourage hygiene. Provide redirection as needed. Assess for escalating behavior; address immediately and inform ED provider.  Assess family dynamic and appropriateness for visitation as needed: Yes.  ; If necessary, describe findings:  Educate the patient/family about BHU procedures/visitation: Yes.  ; If necessary, describe findings:

## 2015-02-23 NOTE — ED Notes (Signed)
IVC/Consult completed by Dr. Toni Amendlapacs @ [phone]

## 2015-02-23 NOTE — ED Notes (Signed)

## 2015-02-23 NOTE — ED Notes (Signed)
BEHAVIORAL HEALTH ROUNDING Patient sleeping: Yes.   Patient alert and oriented: not applicable Behavior appropriate: Yes.  ; If no, describe: sleeping Nutrition and fluids offered: Yes  Toileting and hygiene offered: Yes  Sitter present: yes Law enforcement present: Yes  

## 2015-02-23 NOTE — Consult Note (Signed)
[name] Face-to-Face Psychiatry Consult   Reason for Consult:  Patient here under involuntary commitment. Reports that she had threatened to kill her self. Reports that she had been abusing multiple drugs Referring Physician:  malinda Patient Identification: Jasmine [name] MRN:  [id number] Principal Diagnosis: <principal problem not specified> Diagnosis:   Patient Active Problem List   Diagnosis Date Noted  . Polysubstance abuse [F19.10]     Total Time spent with patient: 20 minutes  Subjective:   Jasmine [name] is a 34 y.o. female patient admitted with patient with a history of substance abuse was referred to the emergency room and is under involuntary commitment. Allegations that she had threatened to kill her self patient chief complaint "I tried to go to my mother's house and she said I was suicidal".  HPI:  Information from the patient and the chart. Patient is extremely sedated and spoke only briefly to me. Couldn't give a more complete history. Paperwork states that she had been at her mother's house and there've been some kind of verbal interaction at which point she had threatened to cut herself or kill herself. Patient told me only that she had tried to go stay the night at her mother's house and that her mother said she was suicidal. The patient wouldn't answer me directly as to whether she had actually made any suicidal statements. Patient wouldn't carry on any other conversation. Paperwork indicates allegations that she's been abusing heroin. Patient wouldn't give me any other information about that.  Past psychiatric history multiple emergency room visits. I note that in March she was sent [facility]. I'm not able to get any other information from her about past psychiatric treatment. Whether she's on any current medicine is unknown. The old emergency room notes do indicate that she has threatened to kill her self before.  Substance abuse history: Known history of multiple drug  abuse including opiates and benzodiazepines.  Social history: Unknown  Medical history: Unknown  Current medications unknown HPI Elements:   Quality:  Suicidal threats. Severity:  Moderate. Not accompanied by any known actual suicide attempt this time. Timing:  Happened transiently and briefly although her current mental state is unclear. Duration:  Last night prior to admission. Context:  Alleged substance abuse and conflict with family.  Past Medical History:  Past Medical History  Diagnosis Date  . Hep C w/o coma, chronic   . Substance abuse     Past Surgical History  Procedure Laterality Date  . Knee arthrocentesis Left   . Tubal ligation N/A    Family History: No family history on file. Social History:  History  Alcohol Use  . Yes    Comment: occ     History  Drug Use  . Yes  . Special: IV    Comment: heroin    History   Social History  . Marital Status: Divorced    Spouse Name: N/A  . Number of Children: N/A  . Years of Education: N/A   Social History Main Topics  . Smoking status: Current Every Day Smoker  . Smokeless tobacco: Not on file  . Alcohol Use: Yes     Comment: occ  . Drug Use: Yes    Special: IV     Comment: heroin  . Sexual Activity: Not on file   Other Topics Concern  . None   Social History Narrative   Additional Social History:  Allergies:  No Known Allergies  Labs: No results found for this or any previous visit (from the past 48 hour(s)).  Vitals: Blood pressure 117/74, pulse 86, temperature 98.2 F (36.8 C), temperature source Oral, resp. rate 16, height 5\' 10"  (1.778 m), weight 81.647 kg (180 lb), SpO2 100 %.  Risk to Self:   Risk to Others:   Prior Inpatient Therapy:   Prior Outpatient Therapy:    No current facility-administered medications for this encounter.   Current Outpatient Prescriptions  Medication Sig Dispense Refill  . ketorolac (TORADOL) 10 MG tablet Take 1 tablet  (10 mg total) by mouth every 6 (six) hours as needed. For pain (Patient not taking: Reported on 01/06/2015) 20 tablet 1  . naproxen (NAPROSYN) 500 MG tablet Take 1 tablet (500 mg total) by mouth 2 (two) times daily. (Patient not taking: Reported on 01/06/2015) 20 tablet 0  . oxyCODONE (ROXICODONE) 5 MG immediate release tablet Take 1 tablet (5 mg total) by mouth every 4 (four) hours as needed for severe pain. (Patient not taking: Reported on 01/06/2015) 30 tablet 0  . penicillin v potassium (VEETID) 500 MG tablet Take 1 tablet (500 mg total) by mouth 3 (three) times daily. (Patient not taking: Reported on 01/06/2015) 30 tablet 0    Musculoskeletal: Strength & Muscle Tone: decreased Gait & Station: unable to stand Patient leans: N/A  Psychiatric Specialty Exam: Physical Exam  Constitutional: She appears well-developed and well-nourished.  HENT:  Head: Normocephalic.  Eyes: Pupils are equal, round, and reactive to light.  Respiratory: Effort normal.  Skin: Skin is dry.  Psychiatric: Her affect is blunt and inappropriate. Her speech is delayed. She is slowed and withdrawn. Cognition and memory are impaired. She expresses impulsivity. She expresses suicidal ideation. She exhibits abnormal recent memory and abnormal remote memory.    Review of Systems  Unable to perform ROS   Blood pressure 117/74, pulse 86, temperature 98.2 F (36.8 C), temperature source Oral, resp. rate 16, height 5\' 10"  (1.778 m), weight 81.647 kg (180 lb), SpO2 100 %.Body mass index is 25.83 kg/(m^2).  General Appearance: Disheveled  Eye Contact::  Absent  Speech:  Blocked  Volume:  Decreased  Mood:  Dysphoric  Affect:  Flat  Thought Process:  Negative  Orientation:  Negative  Thought Content:  Negative  Suicidal Thoughts:  Unknown. Won't answer  Homicidal Thoughts:  Unknown. Won't answer  Memory:  NA  Judgement:  Poor  Insight:  Lacking  Psychomotor Activity:  NA  Concentration:  NA  Recall:  NA  Fund of  Knowledge:NA  Language: NA  Akathisia:  No  Handed:  Right  AIMS (if indicated):     Assets:  Others:  None known  ADL's:  Intact  Cognition: Impaired,  Severe  Sleep:      Medical Decision Making: Established Problem, Worsening (2) and Review of Medication Regimen & Side Effects (2)  Treatment Plan Summary: Plan Patient currently is refusing to engage in appropriate interaction. Won't give me history. Does have a history of threats of suicide in the past. Labs reviewed. She has a positive drug screen for opiates and benzodiazepines. Patient is currently very sedated and can't engage in enough conversation to clarify her suicidality. At this time I can't do anything to leave her under commitment. She can be reevaluated once her mental status improves. If insurance allows she could be referred to rehabilitation again otherwise she can be considered for either admission or discharge.  Plan:  Supportive therapy provided  about ongoing stressors. Disposition: Reevaluate once patient is not so sedated  Hanah Moultry, [facility] 02/23/2015 3:56 PM

## 2015-02-23 NOTE — ED Provider Notes (Signed)
[facility] Emergency Department Provider Note  Time seen: 3:57 PM  I have reviewed the triage vital signs and the nursing notes.   HISTORY  Chief Complaint Suicidal    HPI Jasmine [name] is a 34 y.o. female with a past medical history of substance abuse who presents the emergency department as an involuntary commitment. According to the IVC form the patient has a history of heroin abuse, got into an argument with family and held a knife to her throat threatening to kill her self. The family has taken out and IVC on the patient. Here the patient denies any suicidal intent but is very somnolent. Denies taking any pills or using any drugs today.     Past Medical History  Diagnosis Date  . Hep C w/o coma, chronic   . Substance abuse     Patient Active Problem List   Diagnosis Date Noted  . Polysubstance abuse     Past Surgical History  Procedure Laterality Date  . Knee arthrocentesis Left   . Tubal ligation N/A     Current Outpatient Rx  Name  Route  Sig  Dispense  Refill  . ketorolac (TORADOL) 10 MG tablet   Oral   Take 1 tablet (10 mg total) by mouth every 6 (six) hours as needed. For pain Patient not taking: Reported on 01/06/2015   20 tablet   1   . naproxen (NAPROSYN) 500 MG tablet   Oral   Take 1 tablet (500 mg total) by mouth 2 (two) times daily. Patient not taking: Reported on 01/06/2015   20 tablet   0   . oxyCODONE (ROXICODONE) 5 MG immediate release tablet   Oral   Take 1 tablet (5 mg total) by mouth every 4 (four) hours as needed for severe pain. Patient not taking: Reported on 01/06/2015   30 tablet   0   . penicillin v potassium (VEETID) 500 MG tablet   Oral   Take 1 tablet (500 mg total) by mouth 3 (three) times daily. Patient not taking: Reported on 01/06/2015   30 tablet   0     Allergies Review of patient's allergies indicates no known allergies.  No family history on file.  Social History History  Substance  Use Topics  . Smoking status: Current Every Day Smoker  . Smokeless tobacco: Not on file  . Alcohol Use: Yes     Comment: occ    Review of Systems Constitutional: Negative for fever. Cardiovascular: Negative for chest pain. Abdominal:  Denies abdominal pain.  10-point ROS otherwise negative.  ____________________________________________   PHYSICAL EXAM:  VITAL SIGNS: ED Triage Vitals  Enc Vitals Group     BP 02/23/15 1244 117/74 mmHg     Pulse Rate 02/23/15 1244 86     Resp 02/23/15 1244 16     Temp 02/23/15 1244 98.2 F (36.8 C)     Temp Source 02/23/15 1244 Oral     SpO2 02/23/15 1244 100 %     Weight 02/23/15 1244 180 lb (81.647 kg)     Height 02/23/15 1244 5\' 10"  (1.778 m)     Head Cir --      Peak Flow --      Pain Score --      Pain Loc --      Pain Edu? --      Excl. in GC? --     Constitutional: Alert and oriented, but somnolent Eyes: 2mm pupils, PERRL ENT  Head: Normocephalic and atraumatic. Cardiovascular: Normal rate, regular rhythm.  Respiratory: Normal respiratory effort without tachypnea nor retractions. Breath sounds are clear and equal bilaterally. No wheezes/rales/rhonchi. Gastrointestinal: Soft and nontender. No distention.   Musculoskeletal: Nontender with normal range of motion in all extremities Neurologic:  Slow speech.  No focal deficits identified.  Follows Commands. Skin:  Skin is warm Psychiatric: Mood and affect are normal. Speech and behavior are normal.  ____________________________________________     INITIAL IMPRESSION / ASSESSMENT AND PLAN / ED COURSE  Pertinent labs & imaging results that were available during my care of the patient were reviewed by me and considered in my medical decision making (see chart for details).  Patient with reported suicidal intent, and drug abuse. Patient denies suicidal intent denies taking anything today but is very somnolent suspect likely intoxication.  We'll check labs, continue  involuntary commitment, and have psychiatry evaluate.  ____________________________________________   FINAL CLINICAL IMPRESSION(S) / ED DIAGNOSES  Suicidal ideation Substance abuse   Minna AntisKevin Jaaziah Schulke, MD 02/23/15 1601

## 2015-02-23 NOTE — ED Notes (Addendum)
Patient changed into behavioral scrubs. Patient with 1 bracelet, 1 pair of earrings, 1 nose ring,  3 rings and 1 necklace. Patient also has an earring to her cartilage that she is unable to remove. Patient has shorts, underwear, shirt, bra, headband, sandals. Also purse, wallet, $5 cash, cell phone, cigarettes. Belongings placed in a patient belongings bag and will be secured on nursing unit

## 2015-02-23 NOTE — ED Provider Notes (Signed)
-----------------------------------------   11:13 PM on 02/23/2015 -----------------------------------------   BP 114/68 mmHg  Pulse 84  Temp(Src) 98.3 F (36.8 C) (Oral)  Resp 16  Ht 5\' 10"  (1.778 m)  Wt 180 lb (81.647 kg)  BMI 25.83 kg/m2  SpO2 97%  LMP  (Within Months)  The patient had no acute events since last update.  Calm and cooperative at this time.  Disposition is pending per Psychiatry/Behavioral Medicine team recommendations.     Rebecka ApleyAllison P Makeisha Jentsch, MD 02/23/15 [phone]

## 2015-02-23 NOTE — ED Notes (Addendum)
Patient brought in IVC by [employer]. Patient states, "I'm just not feeling very good". Patient lethargic in triage; denies using any drugs today. Per IVC paperwork, patient is homeless, uses IV heroine, patient had an argument with her mother today, states that she wanted to kill herself, held a knife to her neck and slit her wrists. Patient calm and cooperative at this time. Patient states that she has been clean of heroine for 2 months.

## 2015-02-23 NOTE — ED Notes (Signed)

## 2015-02-24 ENCOUNTER — Encounter: Payer: Self-pay | Admitting: Psychiatry

## 2015-02-24 DIAGNOSIS — F1120 Opioid dependence, uncomplicated: Secondary | ICD-10-CM | POA: Diagnosis present

## 2015-02-24 MED ORDER — CITALOPRAM HYDROBROMIDE 20 MG PO TABS: 40.0000 mg | ORAL_TABLET | Freq: Every day | ORAL | Status: DC

## 2015-02-24 NOTE — ED Notes (Signed)
BEHAVIORAL HEALTH ROUNDING Patient sleeping: Yes.   Patient alert and oriented: not applicable Behavior appropriate: Yes.  ; If no, describe:  Nutrition and fluids offered: No Toileting and hygiene offered: No Sitter present: not applicable Law enforcement present: Yes  

## 2015-02-24 NOTE — ED Notes (Signed)
BEHAVIORAL HEALTH ROUNDING Patient sleeping: Yes.   Patient alert and oriented: not applicable Behavior appropriate: Yes.  ; If no, describe:  Nutrition and fluids offered: Yes  Toileting and hygiene offered: Yes  Sitter present: not applicable Law enforcement present: Yes  

## 2015-02-24 NOTE — ED Notes (Signed)
BEHAVIORAL HEALTH ROUNDING Patient sleeping: No. Patient alert and oriented: yes Behavior appropriate: Yes.  ; If no, describe:  Nutrition and fluids offered: Yes  Toileting and hygiene offered: Yes  Sitter present: not applicable Law enforcement present: Yes  

## 2015-02-24 NOTE — ED Notes (Signed)
BEHAVIORAL HEALTH ROUNDING Patient sleeping: No. Patient alert and oriented: yes Behavior appropriate: No.; If no, describe: can't or won't follow directions (stay in your room after chaning) Nutrition and fluids offered: Yes  Toileting and hygiene offered: Yes  Sitter present: no Law enforcement present: Yes

## 2015-02-24 NOTE — ED Notes (Signed)
BEHAVIORAL HEALTH ROUNDING Patient sleeping: Yes.   Patient alert and oriented: not applicable Behavior appropriate: Yes.   Nutrition and fluids offered: Yes  Toileting and hygiene offered: Yes  Sitter present: yes Law enforcement present: Yes  

## 2015-02-24 NOTE — Consult Note (Signed)
[facility] Consult   Reason for Consult:  For eval for disposition and help Referring Physician:  ER Patient Identification: Jasmine [name] MRN:  [id number] Principal Diagnosis: Heroin use disorder, moderate, dependence Diagnosis:   Patient Active Problem List   Diagnosis Date Noted  . Heroin use disorder, moderate, dependence [F11.20] 02/24/2015  . Opioid dependence with intoxication delirium [F11.221]   . Polysubstance abuse [F19.10]     Total Time spent with patient: 45 minutes  Subjective:   Jasmine [name] is a 34 y.o. female patient admitted with a long H/O heroine dependence and came to [facility] on IVC for help with the same. Mother called ambulance stating that she was suicidal which pt denies.Marland Kitchen  HPI:  Heroine dependence and abuse for few yrs and been stable without the same but wants to get help to continue to stay sober but information obtd from staff that UDS was negative for heroin and was positive with benzos HPI Elements:   Duration:  Been stable with out heroine but is worried as she wants to stay stable and getting depressed about the same..  Past Medical History:  Past Medical History  Diagnosis Date  . Hep C w/o coma, chronic   . Substance abuse     Past Surgical History  Procedure Laterality Date  . Knee arthrocentesis Left   . Tubal ligation N/A    Family History: History reviewed. No pertinent family history. Social History:  History  Alcohol Use  . Yes    Comment: occ     History  Drug Use  . Yes  . Special: IV    Comment: heroin    History   Social History  . Marital Status: Divorced    Spouse Name: N/A  . Number of Children: N/A  . Years of Education: N/A   Social History Main Topics  . Smoking status: Current Every Day Smoker  . Smokeless tobacco: Not on file  . Alcohol Use: Yes     Comment: occ  . Drug Use: Yes    Special: IV     Comment: heroin  . Sexual Activity: Not on file   Other Topics Concern  . None    Social History Narrative   Additional Social History:    Pain Medications: None Reported Prescriptions: None Reported Over the Counter: None Reported History of alcohol / drug use?: Yes Longest period of sobriety (when/how long): Unknown at this time Negative Consequences of Use: Legal, Financial, Personal relationships, Work / School Withdrawal Symptoms:  (None Reported) Name of Substance 1: Heroin 1 - Age of First Use: Unkown 1 - Amount (size/oz): Unknown 1 - Frequency: Daily 1 - Duration: Years Unknown 1 - Last Use / Amount: 3 months ago                   Allergies:  No Known Allergies  Labs:  Results for orders placed or performed during the hospital encounter of 02/23/15 (from the past 48 hour(s))  Acetaminophen level     Status: Abnormal   Collection Time: 02/23/15 12:41 PM  Result Value Ref Range   Acetaminophen (Tylenol), Serum <10 (L) 10 - 30 ug/mL    Comment:        THERAPEUTIC CONCENTRATIONS VARY SIGNIFICANTLY. A RANGE OF 10-30 ug/mL MAY BE AN EFFECTIVE CONCENTRATION FOR MANY PATIENTS. HOWEVER, SOME ARE BEST TREATED AT CONCENTRATIONS OUTSIDE THIS RANGE. ACETAMINOPHEN CONCENTRATIONS >150 ug/mL AT 4 HOURS AFTER INGESTION AND >50 ug/mL AT 12 HOURS AFTER INGESTION  ARE OFTEN ASSOCIATED WITH TOXIC REACTIONS.   CBC     Status: Abnormal   Collection Time: 02/23/15 12:41 PM  Result Value Ref Range   WBC 9.2 3.6 - 11.0 K/uL   RBC 5.28 (H) 3.80 - 5.20 MIL/uL   Hemoglobin 14.0 12.0 - 16.0 g/dL   HCT 42.6 35.0 - 47.0 %   MCV 80.8 80.0 - 100.0 fL   MCH 26.6 26.0 - 34.0 pg   MCHC 32.9 32.0 - 36.0 g/dL   RDW 16.0 (H) 11.5 - 14.5 %   Platelets 209 150 - 440 K/uL  Comprehensive metabolic panel     Status: Abnormal   Collection Time: 02/23/15 12:41 PM  Result Value Ref Range   Sodium 137 135 - 145 mmol/L   Potassium 4.1 3.5 - 5.1 mmol/L   Chloride 105 101 - 111 mmol/L   CO2 25 22 - 32 mmol/L   Glucose, Bld 91 65 - 99 mg/dL   BUN 14 6 - 20 mg/dL    Creatinine, Ser 0.67 0.44 - 1.00 mg/dL   Calcium 9.3 8.9 - 10.3 mg/dL   Total Protein 8.5 (H) 6.5 - 8.1 g/dL   Albumin 4.3 3.5 - 5.0 g/dL   AST 110 (H) 15 - 41 U/L   ALT 207 (H) 14 - 54 U/L   Alkaline Phosphatase 89 38 - 126 U/L   Total Bilirubin 0.6 0.3 - 1.2 mg/dL   GFR calc non Af Amer >60 >60 mL/min   GFR calc Af Amer >60 >60 mL/min    Comment: (NOTE) The eGFR has been calculated using the CKD EPI equation. This calculation has not been validated in all clinical situations. eGFR's persistently <60 mL/min signify possible Chronic Kidney Disease.    Anion gap 7 5 - 15  Ethanol (ETOH)     Status: None   Collection Time: 02/23/15 12:41 PM  Result Value Ref Range   Alcohol, Ethyl (B) <5 <5 mg/dL    Comment:        LOWEST DETECTABLE LIMIT FOR SERUM ALCOHOL IS 11 mg/dL FOR MEDICAL PURPOSES ONLY   Salicylate level     Status: None   Collection Time: 02/23/15 12:41 PM  Result Value Ref Range   Salicylate Lvl <5.1 2.8 - 30.0 mg/dL  Urine Drug Screen, Qualitative [facility])     Status: Abnormal   Collection Time: 02/23/15  5:50 PM  Result Value Ref Range   Tricyclic, Ur Screen NONE DETECTED NONE DETECTED   Amphetamines, Ur Screen NONE DETECTED NONE DETECTED   MDMA (Ecstasy)Ur Screen NONE DETECTED NONE DETECTED   Cocaine Metabolite,Ur [place] NONE DETECTED NONE DETECTED   Opiate, Ur Screen NONE DETECTED NONE DETECTED   Phencyclidine (PCP) Ur S NONE DETECTED NONE DETECTED   Cannabinoid 50 Ng, Ur  NONE DETECTED NONE DETECTED   Barbiturates, Ur Screen NONE DETECTED NONE DETECTED   Benzodiazepine, Ur Scrn POSITIVE (A) NONE DETECTED   Methadone Scn, Ur NONE DETECTED NONE DETECTED    Comment: (NOTE) 700  Tricyclics, urine               Cutoff 1000 ng/mL 200  Amphetamines, urine             Cutoff 1000 ng/mL 300  MDMA (Ecstasy), urine           Cutoff 500 ng/mL 400  Cocaine Metabolite, urine       Cutoff 300 ng/mL 500  Opiate, urine  Cutoff 300 ng/mL 600  Phencyclidine  (PCP), urine      Cutoff 25 ng/mL 700  Cannabinoid, urine              Cutoff 50 ng/mL 800  Barbiturates, urine             Cutoff 200 ng/mL 900  Benzodiazepine, urine           Cutoff 200 ng/mL 1000 Methadone, urine                Cutoff 300 ng/mL 1100 1200 The urine drug screen provides only a preliminary, unconfirmed 1300 analytical test result and should not be used for non-medical 1400 purposes. Clinical consideration and professional judgment should 1500 be applied to any positive drug screen result due to possible 1600 interfering substances. A more specific alternate chemical method 1700 must be used in order to obtain a confirmed analytical result.  1800 Gas chromato graphy / mass spectrometry (GC/MS) is the preferred 1900 confirmatory method.   Urinalysis complete, with microscopic [facility])     Status: Abnormal   Collection Time: 02/23/15  5:50 PM  Result Value Ref Range   Color, Urine YELLOW (A) YELLOW   APPearance CLEAR (A) CLEAR   Glucose, UA NEGATIVE NEGATIVE mg/dL   Bilirubin Urine NEGATIVE NEGATIVE   Ketones, ur NEGATIVE NEGATIVE mg/dL   Specific Gravity, Urine 1.013 1.005 - 1.030   Hgb urine dipstick NEGATIVE NEGATIVE   pH 6.0 5.0 - 8.0   Protein, ur NEGATIVE NEGATIVE mg/dL   Nitrite NEGATIVE NEGATIVE   Leukocytes, UA TRACE (A) NEGATIVE   RBC / HPF 0-5 0 - 5 RBC/hpf   WBC, UA 0-5 0 - 5 WBC/hpf   Bacteria, UA NONE SEEN NONE SEEN   Squamous Epithelial / LPF 0-5 (A) NONE SEEN   Mucous PRESENT     Vitals: Blood pressure 110/76, pulse 70, temperature 98.3 F (36.8 C), temperature source Oral, resp. rate 16, height '5\' 10"'  (1.778 m), weight 81.647 kg (180 lb), SpO2 100 %.  Risk to Self: Suicidal Ideation: Yes-Currently Present Suicidal Intent: Yes-Currently Present Is patient at risk for suicide?: Yes Suicidal Plan?: Yes-Currently Present Specify Current Suicidal Plan: Slitting wrist and throat Access to Means: Yes Specify Access to Suicidal Means: Pt. is able to  get a knife What has been your use of drugs/alcohol within the last 12 months?: Heroin and Alcohol How many times?: 0 Other Self Harm Risks: 0 Triggers for Past Attempts: Family contact, Unpredictable Intentional Self Injurious Behavior: None Risk to Others: Homicidal Ideation: No Thoughts of Harm to Others: No Current Homicidal Intent: No Current Homicidal Plan: No Access to Homicidal Means: No Identified Victim: None reported History of harm to others?: No Assessment of Violence: None Noted Violent Behavior Description: None reported Does patient have access to weapons?: No Criminal Charges Pending?: No Does patient have a court date: No Prior Inpatient Therapy: Prior Inpatient Therapy: Yes Prior Therapy Dates: 2016 Prior Therapy Facilty/Provider(s): [facility] Reason for Treatment: Depression, SI and substance use Prior Outpatient Therapy: Prior Outpatient Therapy: No Prior Therapy Dates: N/A Prior Therapy Facilty/Provider(s): N/A Reason for Treatment: N/A Does patient have an ACCT team?: No Does patient have Intensive In-House Services?  : No Does patient have [facility] services? : No Does patient have P4CC services?: No  Current Facility-Administered Medications  Medication Dose Route Frequency Provider Last Rate Last Dose  . citalopram (CELEXA) tablet 40 mg  40 mg Oral Daily Thaddus Mcdowell K Gwin Eagon,  MD       Current Outpatient Prescriptions  Medication Sig Dispense Refill  . ketorolac (TORADOL) 10 MG tablet Take 1 tablet (10 mg total) by mouth every 6 (six) hours as needed. For pain (Patient not taking: Reported on 01/06/2015) 20 tablet 1  . naproxen (NAPROSYN) 500 MG tablet Take 1 tablet (500 mg total) by mouth 2 (two) times daily. (Patient not taking: Reported on 01/06/2015) 20 tablet 0  . oxyCODONE (ROXICODONE) 5 MG immediate release tablet Take 1 tablet (5 mg total) by mouth every 4 (four) hours as needed for severe pain. (Patient not taking: Reported on 01/06/2015)  30 tablet 0  . penicillin v potassium (VEETID) 500 MG tablet Take 1 tablet (500 mg total) by mouth 3 (three) times daily. (Patient not taking: Reported on 01/06/2015) 30 tablet 0    Musculoskeletal: Strength & Muscle Tone: within normal limits Gait & Station: normal Patient leans: N/A  Psychiatric Specialty Exam: Physical Exam  Constitutional: She appears well-developed and well-nourished.  HENT:  Head: Normocephalic and atraumatic.    Review of Systems  Constitutional: Negative.   HENT: Negative.   Eyes: Negative.   Respiratory: Negative.   Cardiovascular: Negative.   Gastrointestinal: Negative.   Genitourinary: Negative.   Musculoskeletal: Negative.   Neurological: Negative.   Endo/Heme/Allergies: Negative.   Psychiatric/Behavioral: Positive for depression and substance abuse.    Blood pressure 110/76, pulse 70, temperature 98.3 F (36.8 C), temperature source Oral, resp. rate 16, height '5\' 10"'  (1.778 m), weight 81.647 kg (180 lb), SpO2 100 %.Body mass index is 25.83 kg/(m^2).  General Appearance: Negative  Eye Contact::  Good  Speech:  Normal Rate  Volume:  Normal  Mood:  Depressed  Affect:  Constricted  Thought Process:  Goal Directed  Orientation:  Full (Time, Place, and Person)  Thought Content:  Negative  Suicidal Thoughts:  No  Homicidal Thoughts:  No  Memory:  Immediate;   Good Recent;   Good Remote;   Good Intact.  Judgement:  Intact  Insight:  Fair  Psychomotor Activity:  Normal  Concentration:  Fair  Recall:  Good  Fund of Knowledge:Fair  Language: Good  Akathisia:  Negative  Handed:  Right  AIMS (if indicated):     Assets:  Communication Skills Desire for Improvement Leisure Time  ADL's:  Intact  Cognition: WNL  Sleep:      Medical Decision Making: Review of Psycho-Social Stressors (1), Decision to obtain old records (1), Established Problem, Worsening (2), Review of Medication Regimen & Side Effects (2) and Review of New Medication or Change  in Dosage (2)  Treatment Plan Summary: Medication management  Plan:  No evidence of imminent risk to self or others at present.   Disposition: D/C IVC and discharge pt home with follow up with [facility] on out pt basis  Dewain Penning 02/24/2015 5:29 PM

## 2015-02-24 NOTE — ED Notes (Signed)
BEHAVIORAL HEALTH ROUNDING Patient sleeping: No. Patient alert and oriented: yes Behavior appropriate: Yes.  ; If no, describe:   Nutrition and fluids offered: Yes  Toileting and hygiene offered: Yes  Sitter present: no Patent examinerLaw enforcement present: Yes ODS  Report from [place], RN  ED BHU PLACEMENT JUSTIFICATION Is the patient under IVC or is there intent for IVC: No. Is the patient medically cleared: Yes.   Is there vacancy in the ED BHU: Yes.   Is the population mix appropriate for patient: Yes.   Is the patient awaiting placement in inpatient or outpatient setting: No. Has the patient had a psychiatric consult: Yes.   Survey of unit performed for contraband, proper placement and condition of furniture, tampering with fixtures in bathroom, shower, and each patient room: Yes.  ; Findings: none APPEARANCE/BEHAVIOR calm and cooperative NEURO ASSESSMENT Orientation: time, place and person Hallucinations: No.None noted (Hallucinations) Speech: Normal Gait: normal RESPIRATORY ASSESSMENT Normal expansion.  Clear to auscultation.  No rales, rhonchi, or wheezing. CARDIOVASCULAR ASSESSMENT na GASTROINTESTINAL ASSESSMENT na EXTREMITIES no evidence of joint instability PLAN OF CARE Provide calm/safe environment. Vital signs assessed twice daily. ED BHU Assessment once each 12-hour shift. Collaborate with intake RN daily or as condition indicates. Assure the ED provider has rounded once each shift. Provide and encourage hygiene. Provide redirection as needed. Assess for escalating behavior; address immediately and inform ED provider.  Assess family dynamic and appropriateness for visitation as needed: Yes.  ; If necessary, describe findings: na Educate the patient/family about BHU procedures/visitation: Yes.  ; If necessary, describe findings: na

## 2015-02-24 NOTE — ED Notes (Signed)
BEHAVIORAL HEALTH ROUNDING Patient sleeping: Yes.   Patient alert and oriented: not applicable Behavior appropriate: Yes.  ; If no, describe: sleeping Nutrition and fluids offered: Yes  Toileting and hygiene offered: Yes  Sitter present: yes Law enforcement present: Yes  

## 2015-02-24 NOTE — ED Provider Notes (Signed)
-----------------------------------------   8:25 AM on 02/24/2015 -----------------------------------------   BP 114/68 mmHg  Pulse 84  Temp(Src) 98.3 F (36.8 C) (Oral)  Resp 16  Ht 5\' 10"  (1.778 m)  Wt 180 lb (81.647 kg)  BMI 25.83 kg/m2  SpO2 97%  LMP  (Within Months)  No acute events since last update.  Acting appropriately. Does not require one on one sitter at this time.  Disposition is pending per Psychiatry/Behavioral Medicine team recommendations.     Phineas SemenGraydon Yonna Alwin, MD 02/24/15 [phone]

## 2015-02-24 NOTE — Discharge Instructions (Signed)
Polysubstance Abuse  Follow-up with [facility] Monday day. Return to the ER right away should he experience suicidal thoughts or ideations. Stop using illegal substances, this can lead to potential severe injury or death. Not drive  When people abuse more than one drug or type of drug it is called polysubstance or polydrug abuse. For example, many smokers also drink alcohol. This is one form of polydrug abuse. Polydrug abuse also refers to the use of a drug to counteract an unpleasant effect produced by another drug. It may also be used to help with withdrawal from another drug. People who take stimulants may become agitated. Sometimes this agitation is countered with a tranquilizer. This helps protect against the unpleasant side effects. Polydrug abuse also refers to the use of different drugs at the same time.  Anytime drug use is interfering with normal living activities, it has become abuse. This includes problems with family and friends. Psychological dependence has developed when your mind tells you that the drug is needed. This is usually followed by physical dependence which has developed when continuing increases of drug are required to get the same feeling or "high". This is known as addiction or chemical dependency. A person's risk is much higher if there is a history of chemical dependency in the family. SIGNS OF CHEMICAL DEPENDENCY  You have been told by friends or family that drugs have become a problem.  You fight when using drugs.  You are having blackouts (not remembering what you do while using).  You feel sick from using drugs but continue using.  You lie about use or amounts of drugs (chemicals) used.  You need chemicals to get you going.  You are suffering in work performance or in school because of drug use.  You get sick from use of drugs but continue to use anyway.  You need drugs to relate to people or feel comfortable in social situations.  You use drugs to forget  problems. "Yes" answered to any of the above signs of chemical dependency indicates there are problems. The longer the use of drugs continues, the greater the problems will become. If there is a family history of drug or alcohol use, it is best not to experiment with these drugs. Continual use leads to tolerance. After tolerance develops more of the drug is needed to get the same feeling. This is followed by addiction. With addiction, drugs become the most important part of life. It becomes more important to take drugs than participate in the other usual activities of life. This includes relating to friends and family. Addiction is followed by dependency. Dependency is a condition where drugs are now needed not just to get high, but to feel normal. Addiction cannot be cured but it can be stopped. This often requires outside help and the care of professionals. Treatment centers are listed in the yellow pages under: Cocaine, Narcotics, and Alcoholics Anonymous. Most hospitals and clinics can refer you to a specialized care center. Talk to your caregiver if you need help. Document Released: 05/21/2005 Document Revised: 12/22/2011 Document Reviewed: 09/29/2005 [facility] Patient Information 2015 [place], [place]. This information is not intended to replace advice given to you by your health care provider. Make sure you discuss any questions you have with your health care provider.

## 2015-02-24 NOTE — ED Provider Notes (Signed)
Insert time stamp patient was seen and examined by psychiatry Dr. Guss [place] IVC lifted with recommendation to discharge the patient to follow up with [facility].  Patient remains stable alert, with normal vital signs. We'll discharge the patient as recommended by psychiatry service.Sharyn Creamer.  Layton Tappan, MD 02/24/15 [phone]

## 2015-02-24 NOTE — ED Notes (Signed)
BEHAVIORAL HEALTH ROUNDING Patient sleeping: No. Patient alert and oriented: yes Behavior appropriate: Yes.  ; If no, describe:  Nutrition and fluids offered: Yes  Toileting and hygiene offered: Yes  Sitter present: no Law enforcement present: Yes  

## 2015-02-24 NOTE — ED Notes (Signed)
[place], talked with Elita QuickPam, PD dispatched to pickup pt

## 2015-02-24 NOTE — ED Notes (Signed)
Pt changed clothes

## 2015-02-24 NOTE — ED Notes (Signed)
Meal and drink given  

## 2015-05-29 ENCOUNTER — Emergency Department
Admission: EM | Admit: 2015-05-29 | Discharge: 2015-05-30 | Disposition: A | Discharge status: Home / Self Care | Payer: Self-pay | Attending: Emergency Medicine | Admitting: Emergency Medicine

## 2015-05-29 ENCOUNTER — Encounter: Payer: Self-pay | Admitting: Emergency Medicine

## 2015-05-29 DIAGNOSIS — F11221 Opioid dependence with intoxication delirium: Secondary | ICD-10-CM | POA: Diagnosis present

## 2015-05-29 DIAGNOSIS — Z720 Tobacco use: Secondary | ICD-10-CM | POA: Insufficient documentation

## 2015-05-29 DIAGNOSIS — Z79899 Other long term (current) drug therapy: Secondary | ICD-10-CM | POA: Insufficient documentation

## 2015-05-29 DIAGNOSIS — F1410 Cocaine abuse, uncomplicated: Secondary | ICD-10-CM | POA: Insufficient documentation

## 2015-05-29 DIAGNOSIS — Z3202 Encounter for pregnancy test, result negative: Secondary | ICD-10-CM | POA: Insufficient documentation

## 2015-05-29 DIAGNOSIS — F1910 Other psychoactive substance abuse, uncomplicated: Secondary | ICD-10-CM | POA: Diagnosis present

## 2015-05-29 DIAGNOSIS — M25562 Pain in left knee: Secondary | ICD-10-CM | POA: Insufficient documentation

## 2015-05-29 DIAGNOSIS — F1210 Cannabis abuse, uncomplicated: Secondary | ICD-10-CM | POA: Insufficient documentation

## 2015-05-29 DIAGNOSIS — F1110 Opioid abuse, uncomplicated: Secondary | ICD-10-CM | POA: Insufficient documentation

## 2015-05-29 DIAGNOSIS — M25552 Pain in left hip: Secondary | ICD-10-CM | POA: Insufficient documentation

## 2015-05-29 LAB — URINE DRUG SCREEN, QUALITATIVE (ARMC ONLY)
Amphetamines, Ur Screen: NOT DETECTED
BARBITURATES, UR SCREEN: NOT DETECTED
BENZODIAZEPINE, UR SCRN: NOT DETECTED
CANNABINOID 50 NG, UR ~~LOC~~: POSITIVE — AB
Cocaine Metabolite,Ur ~~LOC~~: POSITIVE — AB
MDMA (Ecstasy)Ur Screen: NOT DETECTED
Methadone Scn, Ur: NOT DETECTED
Opiate, Ur Screen: POSITIVE — AB
PHENCYCLIDINE (PCP) UR S: NOT DETECTED
TRICYCLIC, UR SCREEN: NOT DETECTED

## 2015-05-29 LAB — BASIC METABOLIC PANEL
Anion gap: 8 (ref 5–15)
BUN: 14 mg/dL (ref 6–20)
CALCIUM: 8.7 mg/dL — AB (ref 8.9–10.3)
CO2: 25 mmol/L (ref 22–32)
Chloride: 105 mmol/L (ref 101–111)
Creatinine, Ser: 1.16 mg/dL — ABNORMAL HIGH (ref 0.44–1.00)
Glucose, Bld: 178 mg/dL — ABNORMAL HIGH (ref 65–99)
POTASSIUM: 4.2 mmol/L (ref 3.5–5.1)
Sodium: 138 mmol/L (ref 135–145)

## 2015-05-29 LAB — CBC
HCT: 41.1 % (ref 35.0–47.0)
Hemoglobin: 13.5 g/dL (ref 12.0–16.0)
MCH: 28.3 pg (ref 26.0–34.0)
MCHC: 32.8 g/dL (ref 32.0–36.0)
MCV: 86.2 fL (ref 80.0–100.0)
Platelets: 196 10*3/uL (ref 150–440)
RBC: 4.76 MIL/uL (ref 3.80–5.20)
RDW: 15.4 % — AB (ref 11.5–14.5)
WBC: 10.8 10*3/uL (ref 3.6–11.0)

## 2015-05-29 NOTE — ED Notes (Signed)
Pt's family at bedside. Pt becoming very upset with family. Pt's family and pt informed visitors are not allowed at this time. Pt's family escorted back to waiting room.

## 2015-05-29 NOTE — ED Notes (Signed)
Pt's family left waiting room and was given information on behavior unit policies. Pt's mother to be called when pt is released. Sitter at bedside with pt at this time.

## 2015-05-29 NOTE — ED Notes (Signed)
Pt arrives via EMS from home. Per EMS report, pt's friend found pt unresponsive after heroin use. Pt given Narcan IM by EMS. Pt AAOx4 after narcan administration. Pt AAOX4 upon arrival. NAD noted. RR even and nonlabored.

## 2015-05-29 NOTE — ED Notes (Signed)
Swelling and bruising noted to left hand. Pt states "That is where I shot up heroin and I noticed it was swelling."

## 2015-05-30 ENCOUNTER — Emergency Department: Payer: Medicaid Other

## 2015-05-30 ENCOUNTER — Emergency Department: Payer: Self-pay

## 2015-05-30 DIAGNOSIS — F11221 Opioid dependence with intoxication delirium: Secondary | ICD-10-CM

## 2015-05-30 LAB — PREGNANCY, URINE: PREG TEST UR: NEGATIVE

## 2015-05-30 MED ORDER — QUETIAPINE FUMARATE 25 MG PO TABS
50.0000 mg | ORAL_TABLET | Freq: Two times a day (BID) | ORAL | Status: DC
  Administered 2015-05-30: 50 mg via ORAL
  Filled 2015-05-30: qty 2

## 2015-05-30 MED ORDER — NICOTINE 10 MG IN INHA
1.0000 | RESPIRATORY_TRACT | Status: DC | PRN
  Filled 2015-05-30: qty 36

## 2015-05-30 MED ORDER — NALOXONE HCL 0.4 MG/ML IJ SOLN: 0.4000 mg | INTRAMUSCULAR | Status: DC | PRN

## 2015-05-30 NOTE — ED Notes (Signed)
BEHAVIORAL HEALTH ROUNDING Patient sleeping: No. Patient alert and oriented: yes Behavior appropriate: Yes.  ; If no, describe:  Nutrition and fluids offered: Yes Toileting and hygiene offered: Yes  Sitter present: yes Law enforcement present: Yes ODS  

## 2015-05-30 NOTE — ED Notes (Signed)
When asked to move to the quad pt became agitated and asked for pt relations concerning pts rights as IVC, greg RN and Dr. Zenda Alpers sent to bedside

## 2015-05-30 NOTE — ED Notes (Signed)
Pt transferred into ED BHU room 3   Patient assigned to appropriate care area. Patient oriented to unit/care area: Informed that, for their safety, care areas are designed for safety and monitored by security cameras at all times; Visiting hours and phone times explained to patient. Patient verbalizes understanding, and verbal contract for safety obtained.     

## 2015-05-30 NOTE — ED Notes (Signed)
An extra blanket, snack and drink provided  Pt verbalizes that she has not been given a meal today - pt reassured that she will receive a meal at supper time  Snack provided now due to no sandwich's in the fridge

## 2015-05-30 NOTE — ED Provider Notes (Signed)
-----------------------------------------   8:18 PM on 05/30/2015 -----------------------------------------  Patient was seen by psychiatry. They feel she is safe for discharge. Will have patient follow-up with [facility].  Phineas Semen, MD 05/30/15 2019

## 2015-05-30 NOTE — ED Notes (Signed)
Pt moved to BEH quad room 20B.

## 2015-05-30 NOTE — ED Notes (Signed)
ENVIRONMENTAL ASSESSMENT Potentially harmful objects out of patient reach: Yes.   Personal belongings secured: Yes.   Patient dressed in hospital provided attire only: Yes.   Plastic bags out of patient reach: Yes.   Patient care equipment (cords, cables, call bells, lines, and drains) shortened, removed, or accounted for: Yes.   Equipment and supplies removed from bottom of stretcher: Yes.   Potentially toxic materials out of patient reach: Yes.   Sharps container removed or out of patient reach: Yes.     BEHAVIORAL HEALTH ROUNDING Patient sleeping: No. Patient alert and oriented: yes Behavior appropriate: Yes.  ; If no, describe:  Nutrition and fluids offered: yes Toileting and hygiene offered: Yes  Sitter present: q15 minute observations and security camera monitoring Law enforcement present: Yes  ODS  

## 2015-05-30 NOTE — Consult Note (Signed)
[facility] [place] [facility] Consult   Reason for Consult:  Consult for this 34 year old woman with a history of opiate abuse who came into the hospital after overdosing on heroin Referring Physician:  Quale Patient Identification: LINET BRASH MRN:  [id number] Principal Diagnosis: Opioid dependence with intoxication delirium Diagnosis:   Patient Active Problem List   Diagnosis Date Noted  . Heroin use disorder, moderate, dependence [F11.20] 02/24/2015  . Opioid dependence with intoxication delirium [F11.221]   . Polysubstance abuse [F19.10]     Total Time spent with patient: 1 hour  Subjective:   LANIA ZAWISTOWSKI is a 34 y.o. female patient admitted with "I don't have the tolerance I thought I had".  HPI:  Patient is a 34 year old woman with a history of heroin dependence. She tells me the same story that she has told other providers which is that she had been clean for 155 days up until last night. She impulsively used heroin  and shot up an amount that previously she would've tolerated fine. Evidently she overdosed and her friends called 83 and they had to wake her up with Narcan. Patient denies having any suicidal intent whatsoever. Says that she just wanted to get high. Says that she's been using marijuana regularly but stopped that a few days ago. She had been trying to stay sober and had been staying off of opiates for over 150 days which appears to be true based on the drug screens that we have in the chart. Patient says that her mood is been all right. A little bit anxious. Sleeping more or less all right because she takes Seroquel at night. No other new physical complaints. She has recently started going back to school and is very anxious about following up with that.  Past psychiatric history: History of heroin abuse that started about 2 years ago. She doesn't really engage in outpatient treatment. Has been able to quit more or less on her own. Distant past history of a suicide  attempt years ago. Currently prescribes Seroquel by an outpatient psychiatric provider.  Substance abuse history: IV heroin abuse that started a couple years ago as noted above. Daily heavy marijuana use up until a couple days ago.  Family history: Positive for substance abuse in a brother and mental health problems in her mother.  Medical history: Patient denies any medical problems. It mentions hepatitis C in her chart but she says that she believes that is a mistake.  Social history: Currently living on her own. Trying to go back to school.  HPI Elements:   Quality:  Overdose on heroin. Severity:  Severe at the time potentially life threatening. Timing:  Just happened yesterday. Duration:  Resolved. Context:  Ongoing risk of opiate abuse.  Past Medical History:  Past Medical History  Diagnosis Date  . Hep C w/o coma, chronic   . Substance abuse     Past Surgical History  Procedure Laterality Date  . Knee arthrocentesis Left   . Tubal ligation N/A    Family History: No family history on file. Social History:  History  Alcohol Use  . Yes    Comment: occ     History  Drug Use  . Yes  . Special: IV    Comment: heroin    Social History   Social History  . Marital Status: Divorced    Spouse Name: N/A  . Number of Children: N/A  . Years of Education: N/A   Social History Main Topics  . Smoking status:  Current Every Day Smoker  . Smokeless tobacco: None  . Alcohol Use: Yes     Comment: occ  . Drug Use: Yes    Special: IV     Comment: heroin  . Sexual Activity: Not Asked   Other Topics Concern  . None   Social History Narrative   Additional Social History:    Pain Medications: None Reported Prescriptions: None Reported Over the Counter: None Reported History of alcohol / drug use?: Yes Longest period of sobriety (when/how long): 156 days (156 days) Negative Consequences of Use: Personal relationships Withdrawal Symptoms:  (None Reported) Name of  Substance 1: Heroin  1 - Age of First Use: Teenager 1 - Amount (size/oz): $20 1 - Frequency: Once in the 156 days 1 - Duration: Years 1 - Last Use / Amount: 05/29/2015                   Allergies:  No Known Allergies  Labs:  Results for orders placed or performed during the hospital encounter of 05/29/15 (from the past 48 hour(s))  Urine Drug Screen, Qualitative ([facility] only)     Status: Abnormal   Collection Time: 05/29/15 10:35 PM  Result Value Ref Range   Tricyclic, Ur Screen NONE DETECTED NONE DETECTED   Amphetamines, Ur Screen NONE DETECTED NONE DETECTED   MDMA (Ecstasy)Ur Screen NONE DETECTED NONE DETECTED   Cocaine Metabolite,Ur [place] POSITIVE (A) NONE DETECTED   Opiate, Ur Screen POSITIVE (A) NONE DETECTED   Phencyclidine (PCP) Ur S NONE DETECTED NONE DETECTED   Cannabinoid 50 Ng, Ur [place] POSITIVE (A) NONE DETECTED   Barbiturates, Ur Screen NONE DETECTED NONE DETECTED   Benzodiazepine, Ur Scrn NONE DETECTED NONE DETECTED   Methadone Scn, Ur NONE DETECTED NONE DETECTED    Comment: (NOTE) 937  Tricyclics, urine               Cutoff 1000 ng/mL 200  Amphetamines, urine             Cutoff 1000 ng/mL 300  MDMA (Ecstasy), urine           Cutoff 500 ng/mL 400  Cocaine Metabolite, urine       Cutoff 300 ng/mL 500  Opiate, urine                   Cutoff 300 ng/mL 600  Phencyclidine (PCP), urine      Cutoff 25 ng/mL 700  Cannabinoid, urine              Cutoff 50 ng/mL 800  Barbiturates, urine             Cutoff 200 ng/mL 900  Benzodiazepine, urine           Cutoff 200 ng/mL 1000 Methadone, urine                Cutoff 300 ng/mL 1100 1200 The urine drug screen provides only a preliminary, unconfirmed 1300 analytical test result and should not be used for non-medical 1400 purposes. Clinical consideration and professional judgment should 1500 be applied to any positive drug screen result due to possible 1600 interfering substances. A more specific alternate chemical method 1700  must be used in order to obtain a confirmed analytical result.  1800 Gas chromato graphy / mass spectrometry (GC/MS) is the preferred 1900 confirmatory method.   Pregnancy, urine     Status: None   Collection Time: 05/29/15 10:35 PM  Result Value Ref Range   Preg Test, Ur  NEGATIVE NEGATIVE  CBC     Status: Abnormal   Collection Time: 05/29/15 10:37 PM  Result Value Ref Range   WBC 10.8 3.6 - 11.0 K/uL   RBC 4.76 3.80 - 5.20 MIL/uL   Hemoglobin 13.5 12.0 - 16.0 g/dL   HCT 41.1 35.0 - 47.0 %   MCV 86.2 80.0 - 100.0 fL   MCH 28.3 26.0 - 34.0 pg   MCHC 32.8 32.0 - 36.0 g/dL   RDW 15.4 (H) 11.5 - 14.5 %   Platelets 196 150 - 440 K/uL  Basic metabolic panel     Status: Abnormal   Collection Time: 05/29/15 10:37 PM  Result Value Ref Range   Sodium 138 135 - 145 mmol/L   Potassium 4.2 3.5 - 5.1 mmol/L   Chloride 105 101 - 111 mmol/L   CO2 25 22 - 32 mmol/L   Glucose, Bld 178 (H) 65 - 99 mg/dL   BUN 14 6 - 20 mg/dL   Creatinine, Ser 1.16 (H) 0.44 - 1.00 mg/dL   Calcium 8.7 (L) 8.9 - 10.3 mg/dL   GFR calc non Af Amer >60 >60 mL/min   GFR calc Af Amer >60 >60 mL/min    Comment: (NOTE) The eGFR has been calculated using the CKD EPI equation. This calculation has not been validated in all clinical situations. eGFR's persistently <60 mL/min signify possible Chronic Kidney Disease.    Anion gap 8 5 - 15    Vitals: Blood pressure 102/66, pulse 79, temperature 97.9 F (36.6 C), temperature source Oral, resp. rate 18, height _0  (1.778 m), weight 77.111 kg (170 lb), SpO2 99 %.  Risk to Self: Suicidal Ideation: No Suicidal Intent: No Is patient at risk for suicide?: No Suicidal Plan?: No Access to Means: No What has been your use of drugs/alcohol within the last 12 months?: Heroin How many times?: 0 Other Self Harm Risks: None Reported Triggers for Past Attempts: None known Intentional Self Injurious Behavior: None Risk to Others: Homicidal Ideation: No Thoughts of Harm to  Others: No Current Homicidal Intent: No Current Homicidal Plan: No Access to Homicidal Means: No Identified Victim: None Reported History of harm to others?: No Assessment of Violence: None Noted Violent Behavior Description: None Reported Does patient have access to weapons?: No Criminal Charges Pending?: No Does patient have a court date: Yes Court Date:  (Unknown) Prior Inpatient Therapy: Prior Inpatient Therapy: Yes Prior Therapy Dates: Unknown Prior Therapy Facilty/Provider(s): [facility] Reason for Treatment: SA Treatment Prior Outpatient Therapy: Prior Outpatient Therapy: Yes Prior Therapy Dates: Current Reason for Treatment: Substance Use Does patient have an ACCT team?: No Does patient have Intensive In-House Services?  : No Does patient have [facility] services? : No Does patient have P4CC services?: No  Current Facility-Administered Medications  Medication Dose Route Frequency Provider Last Rate Last Dose  . nicotine (NICOTROL) 10 MG inhaler 1 continuous puffing  1 continuous puffing Inhalation PRN Loney Hering, MD      . QUEtiapine (SEROQUEL) tablet 50 mg  50 mg Oral BID Joanne Gavel, MD   50 mg at 05/30/15 1048   Current Outpatient Prescriptions  Medication Sig Dispense Refill  . QUEtiapine (SEROQUEL) 50 MG tablet Take 50 mg by mouth 2 (two) times daily.     Marland Kitchen ketorolac (TORADOL) 10 MG tablet Take 1 tablet (10 mg total) by mouth every 6 (six) hours as needed. For pain (Patient not taking: Reported on 01/06/2015) 20 tablet 1  . naproxen (NAPROSYN) 500 MG  tablet Take 1 tablet (500 mg total) by mouth 2 (two) times daily. (Patient not taking: Reported on 01/06/2015) 20 tablet 0  . oxyCODONE (ROXICODONE) 5 MG immediate release tablet Take 1 tablet (5 mg total) by mouth every 4 (four) hours as needed for severe pain. (Patient not taking: Reported on 01/06/2015) 30 tablet 0  . penicillin v potassium (VEETID) 500 MG tablet Take 1 tablet (500 mg total) by mouth 3 (three) times daily.  (Patient not taking: Reported on 01/06/2015) 30 tablet 0    Musculoskeletal: Strength & Muscle Tone: within normal limits Gait & Station: normal Patient leans: N/A  Psychiatric Specialty Exam: Physical Exam  Constitutional: She appears well-developed and well-nourished.  HENT:  Head: Normocephalic and atraumatic.  Eyes: Pupils are equal, round, and reactive to light. Right conjunctiva is injected.  Neck: Normal range of motion.  Cardiovascular: Normal heart sounds.   Respiratory: Effort normal.  GI: Soft.  Musculoskeletal: Normal range of motion.  Neurological: She is alert.  Skin: Skin is warm and dry.     Psychiatric: Her speech is normal and behavior is normal. Thought content normal. Her affect is blunt. Cognition and memory are normal. She expresses impulsivity.    Review of Systems  Constitutional: Negative.   HENT: Negative.   Eyes: Negative.   Respiratory: Negative.   Cardiovascular: Negative.   Gastrointestinal: Negative.   Musculoskeletal: Negative.   Skin: Negative.   Neurological: Negative.   Psychiatric/Behavioral: Positive for substance abuse. Negative for depression, suicidal ideas and hallucinations. The patient is not nervous/anxious and does not have insomnia.     Blood pressure 102/66, pulse 79, temperature 97.9 F (36.6 C), temperature source Oral, resp. rate 18, height _0  (1.778 m), weight 77.111 kg (170 lb), SpO2 99 %.Body mass index is 24.39 kg/(m^2).  General Appearance: Disheveled  Eye Contact::  Good  Speech:  Slow  Volume:  Normal  Mood:  Euthymic  Affect:  Blunt  Thought Process:  Goal Directed  Orientation:  Full (Time, Place, and Person)  Thought Content:  Negative  Suicidal Thoughts:  No  Homicidal Thoughts:  No  Memory:  Immediate;   Fair Recent;   Fair Remote;   Fair  Judgement:  Intact  Insight:  Shallow  Psychomotor Activity:  Decreased  Concentration:  Good  Recall:  Good  Fund of Knowledge:Good  Language: Good    Akathisia:  No  Handed:  Right  AIMS (if indicated):     Assets:  Communication Skills Desire for Improvement Housing Social Support  ADL's:  Intact  Cognition: WNL  Sleep:      Medical Decision Making: Review or order clinical lab tests (1), Discuss test with performing physician (1), Established Problem, Worsening (2) and Review of Medication Regimen & Side Effects (2)  Treatment Plan Summary: Medication management and Plan After interviewing the patient I believe that she was not trying to kill herself. Does not require inpatient hospitalization does not meet commitment criteria. Unlikely to go through complicated withdrawal after one time use. Supportive counseling completed. Patient is strongly encouraged to get into outpatient treatment through [facility] or to attend NA or AA meetings locally and she is open to those ideas. I'm going to provide her with a prescription for a Narcan injector kit and encourage her to get that filled and keep it around the house in case of further situations like this. Discontinue IVC she can be released from the emergency room.  Plan:  Patient does not meet criteria for psychiatric  inpatient admission. Supportive therapy provided about ongoing stressors. Discussed crisis plan, support from social network, calling 911, coming to the Emergency Department, and calling Suicide Hotline. Disposition: Discontinue IVC she can be discharged and will follow-up with treatment in the community  Alethia Berthold 05/30/2015 7:39 PM

## 2015-05-30 NOTE — BH Assessment (Signed)
Assessment Note  Jasmine [name] is an 34 y.o. female presents to the ER due to overdosing on Heroin. Per the report of the patient, she was clean from heroin for approximately 156 days. However, on last night she started using again. She states, she know that her tolerance level had change, so she didn't want to use as much as she did in the past. Thus, she injected $20 worth of heroin. She states, she don't remember to much of anything after that. Patient passed out due to overdosing. She does remember waking up and seeing EMS and Law Enforcement around her. She states, they had giving Narcan and she responded by saying why. She further explains, she wasn't mad because they had giving to her, because she didn't want to die. In the past when she was giving the medications, she got sick and was vomiting for several hours and that was the first thing that came to her mind.  Patient currently denies SI/HI and AV/H.  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 She states, she have family and friends as support and doing well. However, last night "was a little mistake and I'm not going do that shit no more." She further explains, she has a history of using larger amounts of Heroin and figured $20 wouldn't have been that bad. To her surprise, it was.   Axis I: Substance Abuse Axis III:  Past Medical History  Diagnosis Date  . Hep C w/o coma, chronic   . Substance abuse    Axis IV: economic problems, problems related to social environment, problems with access to health care services and problems with primary support group  Past Medical History:  Past Medical History  Diagnosis Date  . Hep C w/o coma, chronic   . Substance abuse     Past Surgical History  Procedure Laterality Date  . Knee arthrocentesis Left   . Tubal ligation N/A     Family History: No family history on file.  Social History:  reports that she has been smoking.  She does not have any smokeless tobacco history  on file. She reports that she drinks alcohol. She reports that she uses illicit drugs (IV).  Additional Social History:  Alcohol / Drug Use Pain Medications: None Reported Prescriptions: None Reported Over the Counter: None Reported History of alcohol / drug use?: Yes Longest period of sobriety (when/how long): 156 days (156 days) Negative Consequences of Use: Personal relationships Withdrawal Symptoms:  (None Reported) Substance #1 Name of Substance 1: Heroin  1 - Age of First Use: Teenager 1 - Amount (size/oz): $20 1 - Frequency: Once in the 156 days 1 - Duration: Years 1 - Last Use / Amount: 05/29/2015  CIWA: CIWA-Ar BP: 102/66 mmHg Pulse Rate: 79 COWS:    Allergies: No Known Allergies  Home Medications:  (Not in a hospital admission)  OB/GYN Status:  No LMP recorded. Patient is not currently having periods (Reason: Other).  General Assessment Data Location of Assessment: [facility] ED TTS Assessment: In system Is this a Tele or Face-to-Face Assessment?: Face-to-Face Is this an Initial Assessment or a Re-assessment for this encounter?: Initial Assessment Marital status: Single Maiden name: n/a Is patient pregnant?: No Pregnancy Status: No Living Arrangements: Spouse/significant other Can pt return to current living arrangement?: Yes Admission Status: Involuntary Is patient capable of signing voluntary admission?: No Referral Source: Other Insurance type: Medicaid  Medical Screening Exam [facility]) Medical Exam completed: Yes  Crisis Care Plan Living  Arrangements: Spouse/significant other Name of Psychiatrist: n/a Name of Therapist: n/a  Education Status Is patient currently in school?: Yes Current Grade: College Highest grade of school patient has completed: Some Automotive engineer Name of school: [employer] person: n/a  Risk to self with the past 6 months Suicidal Ideation: No Suicidal Intent: No Has patient had any suicidal intent  within the past 6 months prior to admission? : No Is patient at risk for suicide?: No Suicidal Plan?: No Has patient had any suicidal plan within the past 6 months prior to admission? : No Access to Means: No What has been your use of drugs/alcohol within the last 12 months?: Heroin Previous Attempts/Gestures: No How many times?: 0 Other Self Harm Risks: None Reported Triggers for Past Attempts: None known Intentional Self Injurious Behavior: None Family Suicide History: Unknown Recent stressful life event(s): Other (Comment) (Recently Relapsed) Persecutory voices/beliefs?: No Depression: No Depression Symptoms:  (None Reported) Substance abuse history and/or treatment for substance abuse?: Yes Suicide prevention information given to non-admitted patients: Not applicable  Risk to Others within the past 6 months Homicidal Ideation: No Does patient have any lifetime risk of violence toward others beyond the six months prior to admission? : No Thoughts of Harm to Others: No Current Homicidal Intent: No Current Homicidal Plan: No Access to Homicidal Means: No Identified Victim: None Reported History of harm to others?: No Assessment of Violence: None Noted Violent Behavior Description: None Reported Does patient have access to weapons?: No Criminal Charges Pending?: No Does patient have a court date: Yes Court Date:  (Unknown) Is patient on probation?: Yes  Psychosis Hallucinations: None noted Delusions: None noted  Mental Status Report Appearance/Hygiene: Unremarkable, In scrubs, In hospital gown Eye Contact: Good Motor Activity: Freedom of movement, Unremarkable Speech: Unremarkable Level of Consciousness: Alert Mood: Euthymic, Pleasant Affect: Appropriate to circumstance Anxiety Level: None Thought Processes: Coherent, Relevant Judgement: Unimpaired Orientation: Person, Place, Time, Situation, Appropriate for developmental age Obsessive Compulsive  Thoughts/Behaviors: None  Cognitive Functioning Concentration: Normal Memory: Recent Intact, Remote Intact IQ: Average Insight: Poor Impulse Control: Poor Appetite: Good Weight Loss: 0 Weight Gain: 0 Sleep: No Change Total Hours of Sleep: 8 Vegetative Symptoms: None  ADLScreening [facility] Assessment Services) Patient's cognitive ability adequate to safely complete daily activities?: Yes Patient able to express need for assistance with ADLs?: Yes Independently performs ADLs?: Yes (appropriate for developmental age)  Prior Inpatient Therapy Prior Inpatient Therapy: Yes Prior Therapy Dates: Unknown Prior Therapy Facilty/Provider(s): [facility] Reason for Treatment: SA Treatment  Prior Outpatient Therapy Prior Outpatient Therapy: Yes Prior Therapy Dates: Current Reason for Treatment: Substance Use Does patient have an ACCT team?: No Does patient have Intensive In-House Services?  : No Does patient have [facility] services? : No Does patient have P4CC services?: No  ADL Screening (condition at time of admission) Patient's cognitive ability adequate to safely complete daily activities?: Yes Patient able to express need for assistance with ADLs?: Yes Independently performs ADLs?: Yes (appropriate for developmental age)       Abuse/Neglect Assessment (Assessment to be complete while patient is alone) Physical Abuse: Denies Verbal Abuse: Denies Sexual Abuse: Denies Exploitation of patient/patient's resources: Denies Self-Neglect: Denies Values / Beliefs Cultural Requests During Hospitalization: None Spiritual Requests During Hospitalization: None Consults Spiritual Care Consult Needed: No Social Work Consult Needed: No Merchant navy officer (For Healthcare) Does patient have an advance directive?: No Would patient like information on creating an advanced directive?: No - patient declined information    Additional Information 1:1  In Past 12 Months?: No CIRT Risk: No Elopement  Risk: No Does patient have medical clearance?: Yes  Child/Adolescent Assessment Running Away Risk: Denies (Patient is an adult)  Disposition:  Disposition Initial Assessment Completed for this Encounter: Yes Disposition of Patient: Other dispositions Other disposition(s): Other (Comment) (Patient to be seen by [facility] due to being on IVC )  On Site Evaluation by:   Reviewed with Physician:    Lilyan Gilford, MS, LCAS, LPC, NCC, CCSI 05/30/2015 2:04 PM

## 2015-05-30 NOTE — ED Notes (Signed)
Supper provided along with an extra drink  Pt observed with no unusual behavior  Appropriate to stimulation  No verbalized needs or concerns at this time  NAD assessed  Continue to monitor 

## 2015-05-30 NOTE — ED Notes (Signed)
Patient observed lying in bed with eyes closed  Even, unlabored respirations observed   NAD pt appears to be sleeping  I will continue to monitor along with every 15 minute visual observations and ongoing security camera monitoring    

## 2015-05-30 NOTE — ED Notes (Signed)
Pt. Noted in room. No complaints or concerns voiced. No distress or abnormal behavior noted. Will continue to monitor with security cameras. Q 15 minute rounds continue. 

## 2015-05-30 NOTE — ED Notes (Signed)
Pt given breakfast tray, ate 100% 

## 2015-05-30 NOTE — ED Notes (Signed)
Patient transported to X-ray 

## 2015-05-30 NOTE — ED Provider Notes (Signed)
[facility] Emergency Department Provider Note  ____________________________________________  Time seen: Approximately 2323 PM  I have reviewed the triage vital signs and the nursing notes.   HISTORY  Chief Complaint Drug Overdose    HPI Jasmine [name] is a 34 y.o. female who reports that she is unsure how she arrived here and she woke appear. The patient reports that she had been doing heroin prior to her arrival. She reports that she has not done heroin in the 150 days. The patient reports that she's never ended up in the hospital after doing heroin in the past. The patient was given Narcan prior to arrival I EMS. When asked if the patient wants to do detox she reports that she is unable to do so because she is in school. The patient did make a statement to the nurse that she wishes that her friends and family would've just left her there.   Past Medical History  Diagnosis Date  . Hep C w/o coma, chronic   . Substance abuse     Patient Active Problem List   Diagnosis Date Noted  . Heroin use disorder, moderate, dependence 02/24/2015  . Opioid dependence with intoxication delirium   . Polysubstance abuse     Past Surgical History  Procedure Laterality Date  . Knee arthrocentesis Left   . Tubal ligation N/A     Current Outpatient Rx  Name  Route  Sig  Dispense  Refill  . QUEtiapine (SEROQUEL) 50 MG tablet   Oral   Take 50 mg by mouth 2 (two) times daily.          Marland Kitchen ketorolac (TORADOL) 10 MG tablet   Oral   Take 1 tablet (10 mg total) by mouth every 6 (six) hours as needed. For pain Patient not taking: Reported on 01/06/2015   20 tablet   1   . naloxone (NARCAN) 0.4 MG/ML injection   Subcutaneous   Inject 1 mL (0.4 mg total) into the skin as needed.   1 mL   1   . naproxen (NAPROSYN) 500 MG tablet   Oral   Take 1 tablet (500 mg total) by mouth 2 (two) times daily. Patient not taking: Reported on 01/06/2015   20 tablet   0   .  oxyCODONE (ROXICODONE) 5 MG immediate release tablet   Oral   Take 1 tablet (5 mg total) by mouth every 4 (four) hours as needed for severe pain. Patient not taking: Reported on 01/06/2015   30 tablet   0   . penicillin v potassium (VEETID) 500 MG tablet   Oral   Take 1 tablet (500 mg total) by mouth 3 (three) times daily. Patient not taking: Reported on 01/06/2015   30 tablet   0     Allergies Review of patient's allergies indicates no known allergies.  No family history on file.  Social History Social History  Substance Use Topics  . Smoking status: Current Every Day Smoker  . Smokeless tobacco: None  . Alcohol Use: Yes     Comment: occ    Review of Systems Constitutional: No fever/chills Eyes: No visual changes. ENT: No sore throat. Cardiovascular: Denies chest pain. Respiratory: Denies shortness of breath. Gastrointestinal: No abdominal pain.  No nausea, no vomiting.  No diarrhea.  No constipation. Genitourinary: Negative for dysuria. Musculoskeletal: Hip pain and knee pain Skin: Negative for rash. Neurological: Negative for headaches, focal weakness or numbness.  10-point ROS otherwise negative.  ____________________________________________  PHYSICAL EXAM:  VITAL SIGNS: ED Triage Vitals  Enc Vitals Group     BP 05/29/15 2224 114/84 mmHg     Pulse Rate 05/29/15 2224 78     Resp 05/29/15 2224 18     Temp 05/29/15 2224 97.5 F (36.4 C)     Temp Source 05/29/15 2224 Oral     SpO2 05/29/15 2224 100 %     Weight 05/29/15 2224 170 lb (77.111 kg)     Height 05/29/15 2224 5\' 10"  (1.778 m)     Head Cir --      Peak Flow --      Pain Score 05/29/15 2225 0     Pain Loc --      Pain Edu? --      Excl. in GC? --     Constitutional: Alert and oriented. Well appearing and in no acute distress. Eyes: Conjunctivae are normal. PERRL. EOMI. Head: Atraumatic. Nose: No congestion/rhinnorhea. Mouth/Throat: Mucous membranes are moist.  Oropharynx  non-erythematous. Cardiovascular: Normal rate, regular rhythm. Grossly normal heart sounds.  Good peripheral circulation. Respiratory: Normal respiratory effort.  No retractions. Lungs CTAB. Gastrointestinal: Soft and nontender. No distention. Positive bowel sounds Musculoskeletal: Left hip pain. Neurologic:  Normal speech and language. No gross focal neurologic deficits are appreciated.  Skin:  Skin is warm, dry and intact. Psychiatric: Mood and affect are normal.   ____________________________________________   LABS (all labs ordered are listed, but only abnormal results are displayed)  Labs Reviewed  CBC - Abnormal; Notable for the following:    RDW 15.4 (*)    All other components within normal limits  BASIC METABOLIC PANEL - Abnormal; Notable for the following:    Glucose, Bld 178 (*)    Creatinine, Ser 1.16 (*)    Calcium 8.7 (*)    All other components within normal limits  URINE DRUG SCREEN, QUALITATIVE ([facility] ONLY) - Abnormal; Notable for the following:    Cocaine Metabolite,Ur [place] POSITIVE (*)    Opiate, Ur Screen POSITIVE (*)    Cannabinoid 50 Ng, Ur [place] POSITIVE (*)    All other components within normal limits  PREGNANCY, URINE   ____________________________________________  EKG  ED ECG REPORT I, Rebecka Apley, the attending physician, personally viewed and interpreted this ECG.   Date: 05/29/2015  EKG Time: 2234  Rate: 83  Rhythm: normal EKG, normal sinus rhythm  Axis: normal  Intervals:none  ST&T Change: none  ____________________________________________  RADIOLOGY  none ____________________________________________   PROCEDURES  Procedure(s) performed: None  Critical Care performed: No  ____________________________________________   INITIAL IMPRESSION / ASSESSMENT AND PLAN / ED COURSE  Pertinent labs & imaging results that were available during my care of the patient were reviewed by me and considered in my medical decision making (see  chart for details).  She 34 year old female who comes in today after heroin overdose. Although the patient reports that she is fine and she is not trying to hurt herself she did make comments earlier about wishing people with left her at her house. I will have her evaluated by psych for possible suicidal thoughts.  Given the patient's statement about wishing she were left to die had the patient evaluated by the specialist on-call to evaluate for suicidal thoughts or gesture. The psychiatrist to evaluate the patient felt that she was very agitated and was concerned given her statement and her being found unresponsive due to a heroin overdose. The decision at that point was made to involuntarily commit the  patient and have her evaluated by psych while in the emergency department. The patient was not happy with this decision but I informed her that it was for her safety for her to be evaluated given the state in which she was discovered. The patient will remain in the emergency department until she is evaluated by psychiatry here in the hospital. ____________________________________________   FINAL CLINICAL IMPRESSION(S) / ED DIAGNOSES  Final diagnoses:  Opioid abuse      Rebecka Apley, MD 06/04/15 0800

## 2015-05-30 NOTE — ED Notes (Signed)
BEHAVIORAL HEALTH ROUNDING Patient sleeping: Yes.   Patient alert and oriented: eyes closed  Appears asleep Behavior appropriate: Yes.  ; If no, describe:  Nutrition and fluids offered: Yes  Toileting and hygiene offered: sleeping Sitter present: q 15 minute observations and security camera monitoring Law enforcement present: yes  ODS 

## 2015-05-30 NOTE — ED Notes (Signed)
Pt refusing to leave room to go to 20B. Pt states she wants to speak to patient relations.

## 2015-05-30 NOTE — ED Notes (Signed)
Warm blanket, boxed lunch, and cola in styrofoam cup provided to patient. Sitter remains at bedside. NAD noted. RR even and nonlabored. Will continue to monitor.

## 2015-05-30 NOTE — ED Notes (Signed)
Per Dr. Inocencio Homes, pt is to be moved to ED BHU. Report called to Murlean Iba, RN

## 2015-05-30 NOTE — ED Notes (Signed)
Report received from Amy Teague RN. Pt. Sleeping, respirations regular and unlabored.  Will continue to monitor for safety via security cameras and Q 15 minute checks. 

## 2015-05-30 NOTE — ED Notes (Signed)
BEHAVIORAL HEALTH ROUNDING Patient sleeping: Yes.   Patient alert and oriented: yes Behavior appropriate: Yes.  ; If no, describe:  Nutrition and fluids offered: Yes  Toileting and hygiene offered: Yes  Sitter present: yes Law enforcement present: Yes ODS  

## 2015-05-30 NOTE — ED Notes (Signed)

## 2015-05-30 NOTE — Discharge Instructions (Signed)
Please seek medical attention and help for any thoughts about wanting to harm herself, harm others, any concerning change in behavior, severe depression, inappropriate drug use or any other new or concerning symptoms.  Opioid Use Disorder Opioid use disorder is a mental disorder. It is the continued nonmedical use of opioids in spite of risks to health and well-being. Misused opioids include the street drug heroin. They also include pain medicines such as morphine, hydrocodone, oxycodone, and fentanyl. Opioids are very addictive. People who misuse opioids get an exaggerated feeling of well-being. Opioid use disorder often disrupts activities at home, work, or school. It may cause mental or physical problems.  A family history of opioid use disorder puts you at higher risk of it. People with opioid use disorder often misuse other drugs or have mental illness such as depression, posttraumatic stress disorder, or antisocial personality disorder. They also are at risk of suicide and death from overdose. SIGNS AND SYMPTOMS  Signs and symptoms of opioid use disorder include:  Use of opioids in larger amounts or over a longer period than intended.  Unsuccessful attempts to cut down or control opioid use.  A lot of time spent obtaining, using, or recovering from the effects of opioids.  A strong desire or urge to use opioids (craving).  Continued use of opioids in spite of major problems at work, school, or home because of use.  Continued use of opioids in spite of relationship problems because of use.  Giving up or cutting down on important life activities because of opioid use.  Use of opioids over and over in situations when it is physically hazardous, such as driving a car.  Continued use of opioids in spite of a physical problem that is likely related to use. Physical problems can include:  Severe constipation.  Poor nutrition.  Infertility.  Tuberculosis.  Aspiration  pneumonia.  Infections such as human immunodeficiency virus (HIV) and hepatitis (from injecting opioids).  Continued use of opioids in spite of a mental problem that is likely related to use. Mental problems can include:  Depression.  Anxiety.  Hallucinations.  Sleep problems.  Loss of sexual function.  Need to use more and more opioids to get the same effect, or lessened effect over time with use of the same amount (tolerance).  Having withdrawal symptoms when opioid use is stopped, or using opioids to reduce or avoid withdrawal symptoms. Withdrawal symptoms include:  Depressed, anxious, or irritable mood.  Nausea, vomiting, diarrhea, or intestinal cramping.  Muscle aches or spasms.  Excessive tearing or runny nose.  Dilated pupils, sweating, or hairs standing on end.  Yawning.  Fever, raised blood pressure, or fast pulse.  Restlessness or trouble sleeping. This does not apply to people taking opioids for medical reasons only. DIAGNOSIS Opioid use disorder is diagnosed by your health care provider. You may be asked questions about your opioid use and and how it affects your life. A physical exam may be done. A drug screen may be ordered. You may be referred to a mental health professional. The diagnosis of opioid use disorder requires at least two symptoms within 12 months. The type of opioid use disorder you have depends on the number of signs and symptoms you have. The type may be:  Mild. Two or three signs and symptoms.   Moderate. Four or five signs and symptoms.   Severe. Six or more signs and symptoms. TREATMENT  Treatment is usually provided by mental health professionals with training in substance use disorders.The  following options are available: °· Detoxification. This is the first step in treatment for withdrawal. It is medically supervised withdrawal with the use of medicines. These medicines lessen withdrawal symptoms. They also raise the chance of  becoming opioid free. °· Counseling, also known as talk therapy. Talk therapy addresses the reasons you use opioids. It also addresses ways to keep you from using again (relapse). The goals of talk therapy are to avoid relapse by: °¨ Identifying and avoiding triggers for use. °¨ Finding healthy ways to cope with stress. °¨ Learning how to handle cravings. °· Support groups. Support groups provide emotional support, advice, and guidance. °· A medicine that blocks opioid receptors in your brain. This medicine can reduce opioid cravings that lead to relapse. This medicine also blocks the desired opioid effect when relapse occurs. °· Opioids that are taken by mouth in place of the misused opioid (opioid maintenance treatment). These medicines satisfy cravings but are safer than commonly misused opioids. This often is the best option for people who continue to relapse with other treatments. °HOME CARE INSTRUCTIONS  °· Take medicines only as directed by your health care provider. °· Check with your health care provider before starting new medicines. °· Keep all follow-up visits as directed by your health care provider. °SEEK MEDICAL CARE IF: °· You are not able to take your medicines as directed. °· Your symptoms get worse. °SEEK IMMEDIATE MEDICAL CARE IF: °· You have serious thoughts about hurting yourself or others. °· You may have taken an overdose of opioids. °FOR MORE INFORMATION °· National Institute on Drug Abuse: www.drugabuse.gov °· Substance Abuse and Mental Health Services Administration: www.samhsa.gov °Document Released: 07/27/2007 Document Revised: 02/13/2014 Document Reviewed: 10/12/2013 °Exit[employer]® Patient Information ©2015 [employer]. This information is not intended to replace advice given to you by your health care provider. Make sure you discuss any questions you have with your health care provider. ° °

## 2015-05-30 NOTE — ED Notes (Signed)
Pt's IVC papers has been rescinded by Dr. Toni Amend and is currently pending d/c.

## 2015-05-30 NOTE — ED Notes (Signed)
Patient has spoken with tele-psych. He has recommended IVC. Patient aware. Given nicotrol. Sitter at bedside.

## 2015-05-30 NOTE — ED Notes (Signed)
ED BHU PLACEMENT JUSTIFICATION Is the patient under IVC or is there intent for IVC: Yes.   Is the patient medically cleared: Yes.   Is there vacancy in the ED BHU: Yes.   Is the population mix appropriate for patient: Yes.   Is the patient awaiting placement in inpatient or outpatient setting:  Has the patient had a psychiatric consult: consult pending  Survey of unit performed for contraband, proper placement and condition of furniture, tampering with fixtures in bathroom, shower, and each patient room: Yes.  ; Findings:  APPEARANCE/BEHAVIOR Calm and cooperative NEURO ASSESSMENT Orientation: oriented x3  Denies pain Hallucinations: No.None noted (Hallucinations) Speech: Normal Gait: normal RESPIRATORY ASSESSMENT Even  Unlabored respirations  CARDIOVASCULAR ASSESSMENT Pulses equal   regular rate  Skin warm and dry   GASTROINTESTINAL ASSESSMENT no GI complaint EXTREMITIES Full ROM  PLAN OF CARE Provide calm/safe environment. Vital signs assessed twice daily. ED BHU Assessment once each 12-hour shift. Collaborate with intake RN daily or as condition indicates. Assure the ED provider has rounded once each shift. Provide and encourage hygiene. Provide redirection as needed. Assess for escalating behavior; address immediately and inform ED provider.  Assess family dynamic and appropriateness for visitation as needed: Yes.  ; If necessary, describe findings:  Educate the patient/family about BHU procedures/visitation: Yes.  ; If necessary, describe findings:   

## 2015-05-30 NOTE — ED Notes (Signed)
BEHAVIORAL HEALTH ROUNDING Patient sleeping: No. Patient alert and oriented: yes Behavior appropriate: Yes.   Nutrition and fluids offered: Yes  Toileting and hygiene offered: Yes  Sitter present: yes Law enforcement present: Yes   ENVIRONMENTAL ASSESSMENT Potentially harmful objects out of patient reach: Yes.   Personal belongings secured: Yes.   Patient dressed in hospital provided attire only: Yes.   Plastic bags out of patient reach: Yes.   Patient care equipment (cords, cables, call bells, lines, and drains) shortened, removed, or accounted for: Yes.   Equipment and supplies removed from bottom of stretcher: Yes.   Potentially toxic materials out of patient reach: Yes.   Sharps container removed or out of patient reach: Yes.     Pt asking to use phone to call [facility] beacause pt states she started school and needs her check, pt asking to call EMS to file a complaint concerning her missing gages, pt asking to speak to pt relations concerning wanting to talk to a doctor about test results, pt states "I only did a 20, I wasn't trying to kill myself, I only said I wanted to die because they gave me narcan and that reacts bad with heronin, I have been clean for 155 days and yesterday I just got some strong stuff"

## 2015-05-31 ENCOUNTER — Encounter: Payer: Self-pay | Admitting: Emergency Medicine

## 2015-05-31 ENCOUNTER — Emergency Department
Admission: EM | Admit: 2015-05-31 | Discharge: 2015-05-31 | Disposition: A | Discharge status: Home / Self Care | Payer: Medicaid Other | Attending: Emergency Medicine | Admitting: Emergency Medicine

## 2015-05-31 DIAGNOSIS — Z720 Tobacco use: Secondary | ICD-10-CM | POA: Insufficient documentation

## 2015-05-31 DIAGNOSIS — Z79899 Other long term (current) drug therapy: Secondary | ICD-10-CM | POA: Insufficient documentation

## 2015-05-31 DIAGNOSIS — F1124 Opioid dependence with opioid-induced mood disorder: Secondary | ICD-10-CM | POA: Insufficient documentation

## 2015-05-31 DIAGNOSIS — F1120 Opioid dependence, uncomplicated: Secondary | ICD-10-CM

## 2015-05-31 LAB — COMPREHENSIVE METABOLIC PANEL
ALT: 121 U/L — ABNORMAL HIGH (ref 14–54)
ANION GAP: 7 (ref 5–15)
AST: 69 U/L — ABNORMAL HIGH (ref 15–41)
Albumin: 4 g/dL (ref 3.5–5.0)
Alkaline Phosphatase: 74 U/L (ref 38–126)
BUN: 13 mg/dL (ref 6–20)
CHLORIDE: 107 mmol/L (ref 101–111)
CO2: 25 mmol/L (ref 22–32)
CREATININE: 1 mg/dL (ref 0.44–1.00)
Calcium: 9.2 mg/dL (ref 8.9–10.3)
Glucose, Bld: 95 mg/dL (ref 65–99)
POTASSIUM: 4 mmol/L (ref 3.5–5.1)
SODIUM: 139 mmol/L (ref 135–145)
Total Bilirubin: 0.4 mg/dL (ref 0.3–1.2)
Total Protein: 8 g/dL (ref 6.5–8.1)

## 2015-05-31 LAB — ETHANOL

## 2015-05-31 MED ORDER — QUETIAPINE FUMARATE 25 MG PO TABS: 100.0000 mg | ORAL_TABLET | Freq: Every day | ORAL | Status: DC

## 2015-05-31 NOTE — ED Notes (Signed)
Pt calm and cooperative without complaints at this time, will continue to monitor.

## 2015-05-31 NOTE — BHH Counselor (Signed)
Pt. Reported to Clinical research associate that she had been wrongly put under IVC paperwork because the name was not her name.  She requested to speak to Patient Relations.  Writer called Luann in Patient Relations to let her know of the Pt.'s request.

## 2015-05-31 NOTE — ED Notes (Signed)

## 2015-05-31 NOTE — ED Notes (Addendum)
Report received from Margaret RN. Patient care assumed. Patient/RN introduction complete. Will continue to monitor.  

## 2015-05-31 NOTE — ED Notes (Signed)
Pt presents with [facility] with ivc papers taken out by pt mothers due to possible heroin use and thoughts of SI. Pt states she has been heroin free for 155 days until two days ago she used again and was given narcan, pt was committed two days ago and then released. Pt with hx of bipolar disease. Pt denies any thoughts of SI at this time and denies use of heroin today.

## 2015-05-31 NOTE — ED Notes (Signed)
Pt to have a SOC performed  Med orders entered  Pt observed with no unusual behavior  Appropriate to stimulation  No verbalized needs or concerns at this time  NAD assessed  Continue to monitor

## 2015-05-31 NOTE — ED Notes (Signed)

## 2015-05-31 NOTE — ED Notes (Signed)
BEHAVIORAL HEALTH ROUNDING Patient sleeping: No. Patient alert and oriented: yes Behavior appropriate: Yes.  ; If no, describe:  Nutrition and fluids offered: yes Toileting and hygiene offered: Yes  Sitter present: q15 minute observations and security camera monitoring Law enforcement present: Yes  ODS  
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
## 2015-05-31 NOTE — ED Notes (Signed)
Called magistrate about name on custody order told by Teacher, adult education to cross through [employer] and initial it, pt is [employer]

## 2015-05-31 NOTE — ED Notes (Signed)
ED BHU PLACEMENT JUSTIFICATION Is the patient under IVC or is there intent for IVC: Yes.   Is the patient medically cleared: Yes.   Is there vacancy in the ED BHU: Yes.   Is the population mix appropriate for patient: Yes.   Is the patient awaiting placement in inpatient or outpatient setting: Yes.   Has the patient had a psychiatric consult: Yes.   Survey of unit performed for contraband, proper placement and condition of furniture, tampering with fixtures in bathroom, shower, and each patient room: Yes.  ; Findings:  APPEARANCE/BEHAVIOR calm, cooperative and adequate rapport can be established NEURO ASSESSMENT Orientation: time, place and person Hallucinations: No. Speech: Normal Gait: normal RESPIRATORY ASSESSMENT Normal expansion.  Clear to auscultation.  No rales, rhonchi, or wheezing. CARDIOVASCULAR ASSESSMENT regular rate and rhythm, S1, S2 normal, no murmur, click, rub or gallop GASTROINTESTINAL ASSESSMENT soft, nontender, BS WNL, no r/g EXTREMITIES normal strength, tone, and muscle mass PLAN OF CARE Provide calm/safe environment. Vital signs assessed twice daily. ED BHU Assessment once each 12-hour shift. Collaborate with intake RN daily or as condition indicates. Assure the ED provider has rounded once each shift. Provide and encourage hygiene. Provide redirection as needed. Assess for escalating behavior; address immediately and inform ED provider.  Assess family dynamic and appropriateness for visitation as needed: Yes.  ; If necessary, describe findings:  Educate the patient/family about BHU procedures/visitation: Yes.  ; If necessary, describe findings:

## 2015-05-31 NOTE — ED Provider Notes (Signed)
[facility] Emergency Department Provider Note  ____________________________________________  Time seen: 1610  I have reviewed the triage vital signs and the nursing notes.   HISTORY  Chief Complaint Drug Problem   History limited by: Not Limited   HPI Jasmine [name] is a 34 y.o. female who presents to the emergency department today under IVC.The patient states that her mom took the IVC paperwork on on her today so that the mom to get rid of her. The patient denies any suicidal ideation. She does admit to using heroin a couple of days ago however denies any other use. She states she had been clean for 150 days.    Past Medical History  Diagnosis Date  . Hep C w/o coma, chronic   . Substance abuse     Patient Active Problem List   Diagnosis Date Noted  . Heroin use disorder, moderate, dependence 02/24/2015  . Opioid dependence with intoxication delirium   . Polysubstance abuse     Past Surgical History  Procedure Laterality Date  . Knee arthrocentesis Left   . Tubal ligation N/A     Current Outpatient Rx  Name  Route  Sig  Dispense  Refill  . ketorolac (TORADOL) 10 MG tablet   Oral   Take 1 tablet (10 mg total) by mouth every 6 (six) hours as needed. For pain Patient not taking: Reported on 01/06/2015   20 tablet   1   . naloxone (NARCAN) 0.4 MG/ML injection   Subcutaneous   Inject 1 mL (0.4 mg total) into the skin as needed.   1 mL   1   . naproxen (NAPROSYN) 500 MG tablet   Oral   Take 1 tablet (500 mg total) by mouth 2 (two) times daily. Patient not taking: Reported on 01/06/2015   20 tablet   0   . oxyCODONE (ROXICODONE) 5 MG immediate release tablet   Oral   Take 1 tablet (5 mg total) by mouth every 4 (four) hours as needed for severe pain. Patient not taking: Reported on 01/06/2015   30 tablet   0   . penicillin v potassium (VEETID) 500 MG tablet   Oral   Take 1 tablet (500 mg total) by mouth 3 (three) times  daily. Patient not taking: Reported on 01/06/2015   30 tablet   0   . QUEtiapine (SEROQUEL) 50 MG tablet   Oral   Take 50 mg by mouth 2 (two) times daily.            Allergies Review of patient's allergies indicates no known allergies.  No family history on file.  Social History Social History  Substance Use Topics  . Smoking status: Current Every Day Smoker  . Smokeless tobacco: None  . Alcohol Use: Yes     Comment: occ    Review of Systems  Constitutional: Negative for fever. Cardiovascular: Negative for chest pain. Respiratory: Negative for shortness of breath. Gastrointestinal: Negative for abdominal pain, vomiting and diarrhea. Genitourinary: Negative for dysuria. Musculoskeletal: Negative for back pain. Skin: Negative for rash. Neurological: Negative for headaches, focal weakness or numbness.  10-point ROS otherwise negative.  ____________________________________________   PHYSICAL EXAM:  VITAL SIGNS: ED Triage Vitals  Enc Vitals Group     BP 05/31/15 1454 108/75 mmHg     Pulse Rate 05/31/15 1451 121     Resp 05/31/15 1451 22     Temp 05/31/15 1451 98.5 F (36.9 C)     Temp Source 05/31/15  1451 Oral     SpO2 05/31/15 1451 96 %     Weight 05/31/15 1451 165 lb (74.844 kg)     Height 05/31/15 1451 5\' 10"  (1.778 m)     Head Cir --      Peak Flow --      Pain Score 05/31/15 1453 0   Constitutional: Alert and oriented. Well appearing and in no distress. Eyes: Conjunctivae are normal. PERRL. Normal extraocular movements. ENT   Head: Normocephalic and atraumatic.   Nose: No congestion/rhinnorhea.   Mouth/Throat: Mucous membranes are moist.   Neck: No stridor. Hematological/Lymphatic/Immunilogical: No cervical lymphadenopathy. Cardiovascular: Normal rate, regular rhythm.  No murmurs, rubs, or gallops. Respiratory: Normal respiratory effort without tachypnea nor retractions. Breath sounds are clear and equal bilaterally. No  wheezes/rales/rhonchi. Gastrointestinal: Soft and nontender. No distention. There is no CVA tenderness. Genitourinary: Deferred Musculoskeletal: Normal range of motion in all extremities. No joint effusions.  No lower extremity tenderness nor edema. Neurologic:  Normal speech and language. No gross focal neurologic deficits are appreciated. Speech is normal.  Skin:  Skin is warm, dry and intact. No rash noted. Psychiatric: Mood and affect are normal. Speech and behavior are normal. Patient exhibits appropriate insight and judgment.  ____________________________________________    LABS (pertinent positives/negatives)  Labs Reviewed  COMPREHENSIVE METABOLIC PANEL - Abnormal; Notable for the following:    AST 69 (*)    ALT 121 (*)    All other components within normal limits  ETHANOL     ____________________________________________   EKG  None  ____________________________________________    RADIOLOGY  None  ____________________________________________   PROCEDURES  Procedure(s) performed: None  Critical Care performed: No  ____________________________________________   INITIAL IMPRESSION / ASSESSMENT AND PLAN / ED COURSE  Pertinent labs & imaging results that were available during my care of the patient were reviewed by me and considered in my medical decision making (see chart for details).  Patient brought to the emergency department today under IVC because of concerns for heroin abuse and SI. Patient denies any SI does admit to recent heroin abuse. Patient recently in the hospital for similar complaint. Will have psychiatry come evaluate the patient.  ----------------------------------------- 9:41 PM on 05/31/2015 -----------------------------------------  Specialist on call has seen the patient. They feel she is safe for discharge. They did not feel she is a danger to herself. ____________________________________________   FINAL CLINICAL IMPRESSION(S)  / ED DIAGNOSES  Opioid abuse  Phineas Semen, MD 05/31/15 2141

## 2015-05-31 NOTE — ED Notes (Signed)
Pt to room 25 for [facility]

## 2015-05-31 NOTE — ED Notes (Signed)
Pt transferred into ED BHU  Room 5    Patient assigned to appropriate care area. Patient oriented to unit/care area: Informed that, for their safety, care areas are designed for safety and monitored by security cameras at all times; Visiting hours and phone times explained to patient. Patient verbalizes understanding, and verbal contract for safety obtained.

## 2015-05-31 NOTE — ED Notes (Signed)
Mother Kendal Hymen contacted to pick pt up and mother states she is not willing to pick her up or be responsible for her.

## 2015-05-31 NOTE — ED Notes (Signed)
Patient discharged to home per MD order. Patient in stable condition, and deemed medically cleared by ED provider for discharge. Discharge instructions reviewed with patient/family using "Teach Back"; verbalized understanding of medication education and administration, and information about follow-up care. Denies further concerns. ° °

## 2015-05-31 NOTE — ED Notes (Signed)
Pt given meal tray.

## 2015-05-31 NOTE — Discharge Instructions (Signed)
Please seek medical attention and help for any thoughts about wanting to harm herself, harm others, any concerning change in behavior, severe depression, inappropriate drug use or any other new or concerning symptoms.  Opioid Use Disorder Opioid use disorder is a mental disorder. It is the continued nonmedical use of opioids in spite of risks to health and well-being. Misused opioids include the street drug heroin. They also include pain medicines such as morphine, hydrocodone, oxycodone, and fentanyl. Opioids are very addictive. People who misuse opioids get an exaggerated feeling of well-being. Opioid use disorder often disrupts activities at home, work, or school. It may cause mental or physical problems.  A family history of opioid use disorder puts you at higher risk of it. People with opioid use disorder often misuse other drugs or have mental illness such as depression, posttraumatic stress disorder, or antisocial personality disorder. They also are at risk of suicide and death from overdose. SIGNS AND SYMPTOMS  Signs and symptoms of opioid use disorder include:  Use of opioids in larger amounts or over a longer period than intended.  Unsuccessful attempts to cut down or control opioid use.  A lot of time spent obtaining, using, or recovering from the effects of opioids.  A strong desire or urge to use opioids (craving).  Continued use of opioids in spite of major problems at work, school, or home because of use.  Continued use of opioids in spite of relationship problems because of use.  Giving up or cutting down on important life activities because of opioid use.  Use of opioids over and over in situations when it is physically hazardous, such as driving a car.  Continued use of opioids in spite of a physical problem that is likely related to use. Physical problems can include:  Severe constipation.  Poor nutrition.  Infertility.  Tuberculosis.  Aspiration  pneumonia.  Infections such as human immunodeficiency virus (HIV) and hepatitis (from injecting opioids).  Continued use of opioids in spite of a mental problem that is likely related to use. Mental problems can include:  Depression.  Anxiety.  Hallucinations.  Sleep problems.  Loss of sexual function.  Need to use more and more opioids to get the same effect, or lessened effect over time with use of the same amount (tolerance).  Having withdrawal symptoms when opioid use is stopped, or using opioids to reduce or avoid withdrawal symptoms. Withdrawal symptoms include:  Depressed, anxious, or irritable mood.  Nausea, vomiting, diarrhea, or intestinal cramping.  Muscle aches or spasms.  Excessive tearing or runny nose.  Dilated pupils, sweating, or hairs standing on end.  Yawning.  Fever, raised blood pressure, or fast pulse.  Restlessness or trouble sleeping. This does not apply to people taking opioids for medical reasons only. DIAGNOSIS Opioid use disorder is diagnosed by your health care provider. You may be asked questions about your opioid use and and how it affects your life. A physical exam may be done. A drug screen may be ordered. You may be referred to a mental health professional. The diagnosis of opioid use disorder requires at least two symptoms within 12 months. The type of opioid use disorder you have depends on the number of signs and symptoms you have. The type may be:  Mild. Two or three signs and symptoms.   Moderate. Four or five signs and symptoms.   Severe. Six or more signs and symptoms. TREATMENT  Treatment is usually provided by mental health professionals with training in substance use disorders.The  following options are available:  Detoxification.This is the first step in treatment for withdrawal. It is medically supervised withdrawal with the use of medicines. These medicines lessen withdrawal symptoms. They also raise the chance of  becoming opioid free.  Counseling, also known as talk therapy. Talk therapy addresses the reasons you use opioids. It also addresses ways to keep you from using again (relapse). The goals of talk therapy are to avoid relapse by:  Identifying and avoiding triggers for use.  Finding healthy ways to cope with stress.  Learning how to handle cravings.  Support groups. Support groups provide emotional support, advice, and guidance.  A medicine that blocks opioid receptors in your brain. This medicine can reduce opioid cravings that lead to relapse. This medicine also blocks the desired opioid effect when relapse occurs.  Opioids that are taken by mouth in place of the misused opioid (opioid maintenance treatment). These medicines satisfy cravings but are safer than commonly misused opioids. This often is the best option for people who continue to relapse with other treatments. HOME CARE INSTRUCTIONS   Take medicines only as directed by your health care provider.  Check with your health care provider before starting new medicines.  Keep all follow-up visits as directed by your health care provider. SEEK MEDICAL CARE IF:  You are not able to take your medicines as directed.  Your symptoms get worse. SEEK IMMEDIATE MEDICAL CARE IF:  You have serious thoughts about hurting yourself or others.  You may have taken an overdose of opioids. FOR MORE INFORMATION  National Institute on Drug Abuse: http://www.price-smith.com/  Substance Abuse and Mental Health Services Administration: SkateOasis.com.pt Document Released: 07/27/2007 Document Revised: 02/13/2014 Document Reviewed: 10/12/2013 [facility] Patient Information 2015 [place], [place]. This information is not intended to replace advice given to you by your health care provider. Make sure you discuss any questions you have with your health care provider.

## 2015-05-31 NOTE — ED Notes (Signed)
Put one white colored ring and two white colored gage earrings in spec. Cup with pt label on it while pt was present.

## 2015-05-31 NOTE — ED Notes (Signed)
Called [employer] asked about the address and she informed me to go by what she put on custody order as that is the information she had.  The address in [employer] computer states [place] and the custodyorder states [place].

## 2015-05-31 NOTE — ED Notes (Signed)
ED BHU PLACEMENT JUSTIFICATION Is the patient under IVC or is there intent for IVC: Yes.   Is the patient medically cleared: Yes.   Is there vacancy in the ED BHU: Yes.   Is the population mix appropriate for patient: Yes.   Is the patient awaiting placement in inpatient or outpatient setting:  Has the patient had a psychiatric consult: consult pending  Survey of unit performed for contraband, proper placement and condition of furniture, tampering with fixtures in bathroom, shower, and each patient room: Yes.  ; Findings:  APPEARANCE/BEHAVIOR Calm and cooperative NEURO ASSESSMENT Orientation: oriented x3  Denies pain Hallucinations: No.None noted (Hallucinations) Speech: Normal Gait: normal RESPIRATORY ASSESSMENT Even  Unlabored respirations  CARDIOVASCULAR ASSESSMENT Pulses equal   regular rate  Skin warm and dry   GASTROINTESTINAL ASSESSMENT no GI complaint EXTREMITIES Full ROM  PLAN OF CARE Provide calm/safe environment. Vital signs assessed twice daily. ED BHU Assessment once each 12-hour shift. Collaborate with intake RN daily or as condition indicates. Assure the ED provider has rounded once each shift. Provide and encourage hygiene. Provide redirection as needed. Assess for escalating behavior; address immediately and inform ED provider.  Assess family dynamic and appropriateness for visitation as needed: Yes.  ; If necessary, describe findings:  Educate the patient/family about BHU procedures/visitation: Yes.  ; If necessary, describe findings:   

## 2015-06-04 ENCOUNTER — Encounter: Payer: Self-pay | Admitting: Emergency Medicine

## 2015-06-04 ENCOUNTER — Emergency Department
Admission: EM | Admit: 2015-06-04 | Discharge: 2015-06-04 | Disposition: A | Discharge status: Home / Self Care | Payer: Medicaid Other | Attending: Emergency Medicine | Admitting: Emergency Medicine

## 2015-06-04 DIAGNOSIS — Z720 Tobacco use: Secondary | ICD-10-CM | POA: Insufficient documentation

## 2015-06-04 DIAGNOSIS — B9689 Other specified bacterial agents as the cause of diseases classified elsewhere: Secondary | ICD-10-CM

## 2015-06-04 DIAGNOSIS — Z79899 Other long term (current) drug therapy: Secondary | ICD-10-CM | POA: Insufficient documentation

## 2015-06-04 DIAGNOSIS — L089 Local infection of the skin and subcutaneous tissue, unspecified: Secondary | ICD-10-CM | POA: Insufficient documentation

## 2015-06-04 MED ORDER — SULFAMETHOXAZOLE-TRIMETHOPRIM 800-160 MG PO TABS: 1.0000 | ORAL_TABLET | Freq: Two times a day (BID) | ORAL | Status: DC

## 2015-06-04 MED ORDER — SULFAMETHOXAZOLE-TRIMETHOPRIM 800-160 MG PO TABS
1.0000 | ORAL_TABLET | Freq: Once | ORAL | Status: AC
  Administered 2015-06-04: 1 via ORAL
  Filled 2015-06-04: qty 1

## 2015-06-04 MED ORDER — IBUPROFEN 800 MG PO TABS: 800.0000 mg | ORAL_TABLET | Freq: Three times a day (TID) | ORAL | Status: DC | PRN

## 2015-06-04 MED ORDER — OXYCODONE-ACETAMINOPHEN 5-325 MG PO TABS
2.0000 | ORAL_TABLET | Freq: Once | ORAL | Status: AC
  Administered 2015-06-04: 2 via ORAL
  Filled 2015-06-04: qty 2

## 2015-06-04 MED ORDER — TRAMADOL HCL 50 MG PO TABS: 50.0000 mg | ORAL_TABLET | Freq: Four times a day (QID) | ORAL | Status: DC | PRN

## 2015-06-04 NOTE — ED Provider Notes (Signed)
[facility] Emergency Department Provider Note  ____________________________________________  Time seen: Approximately 8:05 PM  I have reviewed the triage vital signs and the nursing notes.   HISTORY  Chief Complaint Cyst    HPI Jasmine [name] is a 34 y.o. female patient complain nausea lesion right axillary area for one week.Patient states she was at the area. Range express a small amount of material out of it yesterday. Patient states today she poked it with a states pain and express blood out of this time. Patient denies any fever. Patient states she took some leftover amoxicillin but she did not remedy dosage.   Past Medical History  Diagnosis Date  . Hep C w/o coma, chronic   . Substance abuse     Patient Active Problem List   Diagnosis Date Noted  . Heroin use disorder, moderate, dependence 02/24/2015  . Opioid dependence with intoxication delirium   . Polysubstance abuse     Past Surgical History  Procedure Laterality Date  . Knee arthrocentesis Left   . Tubal ligation N/A     Current Outpatient Rx  Name  Route  Sig  Dispense  Refill  . ketorolac (TORADOL) 10 MG tablet   Oral   Take 1 tablet (10 mg total) by mouth every 6 (six) hours as needed. For pain Patient not taking: Reported on 01/06/2015   20 tablet   1   . naloxone (NARCAN) 0.4 MG/ML injection   Subcutaneous   Inject 1 mL (0.4 mg total) into the skin as needed.   1 mL   1   . naproxen (NAPROSYN) 500 MG tablet   Oral   Take 1 tablet (500 mg total) by mouth 2 (two) times daily. Patient not taking: Reported on 01/06/2015   20 tablet   0   . oxyCODONE (ROXICODONE) 5 MG immediate release tablet   Oral   Take 1 tablet (5 mg total) by mouth every 4 (four) hours as needed for severe pain. Patient not taking: Reported on 01/06/2015   30 tablet   0   . penicillin v potassium (VEETID) 500 MG tablet   Oral   Take 1 tablet (500 mg total) by mouth 3 (three) times  daily. Patient not taking: Reported on 01/06/2015   30 tablet   0   . QUEtiapine (SEROQUEL) 50 MG tablet   Oral   Take 50 mg by mouth 2 (two) times daily.            Allergies Review of patient's allergies indicates no known allergies.  No family history on file.  Social History Social History  Substance Use Topics  . Smoking status: Current Every Day Smoker  . Smokeless tobacco: None  . Alcohol Use: Yes     Comment: occ    Review of Systems Constitutional: No fever/chills Eyes: No visual changes. ENT: No sore throat. Cardiovascular: Denies chest pain. Respiratory: Denies shortness of breath. Gastrointestinal: No abdominal pain.  No nausea, no vomiting.  No diarrhea.  No constipation. Genitourinary: Negative for dysuria. Musculoskeletal: Negative for back pain. Skin: Negative for rash. Abscess left axillary area Neurological: Negative for headaches, focal weakness or numbness. 10-point ROS otherwise negative.  ____________________________________________   PHYSICAL EXAM:  VITAL SIGNS: ED Triage Vitals  Enc Vitals Group     BP 06/04/15 1829 113/79 mmHg     Pulse Rate 06/04/15 1829 110     Resp 06/04/15 1829 18     Temp 06/04/15 1829 98.2 F (36.8 C)  Temp Source 06/04/15 1829 Oral     SpO2 06/04/15 1829 100 %     Weight 06/04/15 1827 160 lb (72.576 kg)     Height 06/04/15 1827 5\' 10"  (1.778 m)     Head Cir --      Peak Flow --      Pain Score 06/04/15 1828 7     Pain Loc --      Pain Edu? --      Excl. in GC? --     Constitutional: Alert and oriented. Well appearing and in no acute distress. Eyes: Conjunctivae are normal. PERRL. EOMI. Head: Atraumatic. Nose: No congestion/rhinnorhea. Mouth/Throat: Mucous membranes are moist.  Oropharynx non-erythematous. Neck: No stridor. No cervical spine tenderness to palpation. Hematological/Lymphatic/Immunilogical: No cervical lymphadenopathy. Cardiovascular: Normal rate, regular rhythm. Grossly normal  heart sounds.  Good peripheral circulation. Respiratory: Normal respiratory effort.  No retractions. Lungs CTAB. Gastrointestinal: Soft and nontender. No distention. No abdominal bruits. No CVA tenderness. Musculoskeletal: No lower extremity tenderness nor edema.  No joint effusions. Neurologic:  Normal speech and language. No gross focal neurologic deficits are appreciated. No gait instability. Skin:  Skin is warm, dry and intact. No rash noted. Papular lesion on erythematous base. Lesion is not fluctuant. Psychiatric: Mood and affect are normal. Speech and behavior are normal.  ____________________________________________   LABS (all labs ordered are listed, but only abnormal results are displayed)  Labs Reviewed - No data to display ____________________________________________  EKG   ____________________________________________  RADIOLOGY   ____________________________________________   PROCEDURES  Procedure(s) performed: None  Critical Care performed: No  ____________________________________________   INITIAL IMPRESSION / ASSESSMENT AND PLAN / ED COURSE  Pertinent labs & imaging results that were available during my care of the patient were reviewed by me and considered in my medical decision making (see chart for details).  Skin infection left axillary area. Discussed rationale for performing Incision and drainage at this time. Patient given prescription for Bactrim tramadol and ibuprofen. Patient advised on home care and advised return back to ER if condition worsens. ____________________________________________   FINAL CLINICAL IMPRESSION(S) / ED DIAGNOSES  Final diagnoses:  Bacterial skin infection      Joni Reining, PA-C 06/04/15 2016  Phineas Semen, MD 06/04/15 2108

## 2015-06-04 NOTE — ED Notes (Signed)
Patient present to ED with c/o cyst in right armpit. Patient reports went to tubing on Saturday 05/27/15 and began to notice cyst on armpit on Tuesday, reports patient was seen on Tuesday and Thursday for symptoms. Reports pain has increased and redness has been getting worse. Patient reports was taken 6 pills of Amoxicillin a day, patient reports she does not know the dose. Patient states "I poke it with a syringe and pus came out of it...then I poked it with safety pin and blood came out." Redness and swelling noted to right armpit and hot to touch. When asked if patient had fevers at home patient states "they felt my head at home and they said it felt warm but I do not have a thermometer." Patient alert and oriented x 4, respirations even and unlabored.

## 2015-06-04 NOTE — ED Notes (Signed)
Pt states she has a cyst or knot under her right arm, area red and painful, pt states it only drains "watery" looking discharge when poked.

## 2015-12-30 DIAGNOSIS — G8929 Other chronic pain: Secondary | ICD-10-CM | POA: Insufficient documentation

## 2015-12-30 DIAGNOSIS — T391X1A Poisoning by 4-Aminophenol derivatives, accidental (unintentional), initial encounter: Secondary | ICD-10-CM | POA: Insufficient documentation

## 2015-12-30 DIAGNOSIS — M545 Low back pain: Secondary | ICD-10-CM

## 2015-12-30 DIAGNOSIS — K047 Periapical abscess without sinus: Secondary | ICD-10-CM | POA: Insufficient documentation

## 2015-12-30 DIAGNOSIS — R7401 Elevation of levels of liver transaminase levels: Secondary | ICD-10-CM | POA: Insufficient documentation

## 2015-12-30 DIAGNOSIS — R740 Nonspecific elevation of levels of transaminase and lactic acid dehydrogenase [LDH]: Secondary | ICD-10-CM

## 2015-12-30 DIAGNOSIS — Z720 Tobacco use: Secondary | ICD-10-CM | POA: Insufficient documentation

## 2015-12-31 DIAGNOSIS — A5901 Trichomonal vulvovaginitis: Secondary | ICD-10-CM | POA: Insufficient documentation

## 2017-11-17 ENCOUNTER — Emergency Department
Admission: EM | Admit: 2017-11-17 | Discharge: 2017-11-17 | Disposition: A | Discharge status: Home / Self Care | Payer: Self-pay | Attending: Emergency Medicine | Admitting: Emergency Medicine

## 2017-11-17 ENCOUNTER — Encounter: Payer: Self-pay | Admitting: Emergency Medicine

## 2017-11-17 ENCOUNTER — Other Ambulatory Visit: Payer: Self-pay

## 2017-11-17 DIAGNOSIS — Z0289 Encounter for other administrative examinations: Secondary | ICD-10-CM | POA: Insufficient documentation

## 2017-11-17 DIAGNOSIS — F17210 Nicotine dependence, cigarettes, uncomplicated: Secondary | ICD-10-CM | POA: Insufficient documentation

## 2017-11-17 DIAGNOSIS — T192XXA Foreign body in vulva and vagina, initial encounter: Secondary | ICD-10-CM

## 2017-11-17 DIAGNOSIS — Z79899 Other long term (current) drug therapy: Secondary | ICD-10-CM | POA: Insufficient documentation

## 2017-11-17 DIAGNOSIS — T185XXA Foreign body in anus and rectum, initial encounter: Secondary | ICD-10-CM

## 2017-11-17 DIAGNOSIS — F11221 Opioid dependence with intoxication delirium: Secondary | ICD-10-CM | POA: Insufficient documentation

## 2017-11-17 NOTE — Discharge Instructions (Signed)
Nothing was discovered on speculum exam or digital rectal exam. Please monitor bowel movements for the next 2-3 days to ensure that there is no rectal foreign body.

## 2017-11-17 NOTE — ED Notes (Signed)
Pt escorted to bathroom by female [place] [employer] officer to urinate; 2 hats placed in commode to examine for any foreign bodies expelled; hats emptied only of urine with no foreign bodies noted in hats or commode

## 2017-11-17 NOTE — ED Triage Notes (Addendum)
Patient ambulatory to triage with steady gait, without difficulty or distress noted, in custody of [place] [employer] officer with search warrant to r/o hidden drugs/paraphenalia; st officer witnessed pt attempted to hide object beneath skirt

## 2017-11-17 NOTE — ED Provider Notes (Signed)
[facility] Emergency Department Provider Note   ____________________________________________   First MD Initiated Contact with Patient 11/17/17 0502     (approximate)  I have reviewed the triage vital signs and the nursing notes.   HISTORY  Chief Complaint Foreign Body    HPI Jasmine [name] is a 37 y.o. female who comes into the hospital today with police.  The police states that they need the patient to have her cavities checked.  They have a search warrant for drugs.  The patient was being arrested and the arresting officer states that he saw the patient put something under her skirt.  A female officer stripped the patient but did not find any paraphernalia so they received a search warrant to have the patient brought in to have her cavities discharged in the emergency department.  The patient has no complaints except that she states her urine is orange but she has not been drinking much.   Past Medical History:  Diagnosis Date  . Hep C w/o coma, chronic (HCC)   . Substance abuse [facility](HCC)     Patient Active Problem List   Diagnosis Date Noted  . Heroin use disorder, moderate, dependence (HCC) 02/24/2015  . Opioid dependence with intoxication delirium (HCC)   . Polysubstance abuse [facility](HCC)     Past Surgical History:  Procedure Laterality Date  . KNEE ARTHROCENTESIS Left   . TUBAL LIGATION N/A     Prior to Admission medications   Medication Sig Start Date End Date Taking? Authorizing Provider  ibuprofen (ADVIL,MOTRIN) 800 MG tablet Take 1 tablet (800 mg total) by mouth every 8 (eight) hours as needed for moderate pain. 06/04/15   Joni ReiningSmith, Ronald K, PA-C  ketorolac (TORADOL) 10 MG tablet Take 1 tablet (10 mg total) by mouth every 6 (six) hours as needed. For pain Patient not taking: Reported on 01/06/2015 06/06/14   Linna HoffKindl, James D, MD  naloxone [facility](NARCAN) 0.4 MG/ML injection Inject 1 mL (0.4 mg total) into the skin as needed. 05/30/15   Clapacs, Jackquline DenmarkJohn T, MD    naproxen (NAPROSYN) 500 MG tablet Take 1 tablet (500 mg total) by mouth 2 (two) times daily. Patient not taking: Reported on 01/06/2015 05/30/14   Felicie MornSmith, David, NP  oxyCODONE (ROXICODONE) 5 MG immediate release tablet Take 1 tablet (5 mg total) by mouth every 4 (four) hours as needed for severe pain. Patient not taking: Reported on 01/06/2015 05/30/14   Felicie MornSmith, David, NP  penicillin v potassium (VEETID) 500 MG tablet Take 1 tablet (500 mg total) by mouth 3 (three) times daily. Patient not taking: Reported on 01/06/2015 05/30/14   Felicie MornSmith, David, NP  QUEtiapine (SEROQUEL) 50 MG tablet Take 50 mg by mouth 2 (two) times daily.     [provider]  sulfamethoxazole-trimethoprim (BACTRIM DS,SEPTRA DS) 800-160 MG per tablet Take 1 tablet by mouth 2 (two) times daily. 06/04/15   Joni ReiningSmith, Ronald K, PA-C  traMADol (ULTRAM) 50 MG tablet Take 1 tablet (50 mg total) by mouth every 6 (six) hours as needed for moderate pain. 06/04/15   Joni ReiningSmith, Ronald K, PA-C    Allergies Patient has no known allergies.  No family history on file.  Social History Social History   Tobacco Use  . Smoking status: Current Every Day Smoker  . Smokeless tobacco: Never Used  Substance Use Topics  . Alcohol use: Yes    Comment: occ  . Drug use: Yes    Types: IV    Comment: heroin  Review of Systems  Constitutional: No fever/chills Eyes: No visual changes. ENT: No sore throat. Cardiovascular: Denies chest pain. Respiratory: Denies shortness of breath. Gastrointestinal: No abdominal pain.  No nausea, no vomiting.  No diarrhea.  No constipation. Genitourinary: Negative for dysuria. Musculoskeletal: Negative for back pain. Skin: Negative for rash. Neurological: Negative for headaches, focal weakness or numbness.   ____________________________________________   PHYSICAL EXAM:  VITAL SIGNS: ED Triage Vitals  Enc Vitals Group     BP 11/17/17 0101 103/71     Pulse Rate 11/17/17 0101 (!) 110     Resp 11/17/17  0101 20     Temp 11/17/17 0101 98.4 F (36.9 C)     Temp Source 11/17/17 0101 Oral     SpO2 11/17/17 0101 98 %     Weight 11/17/17 0059 220 lb (99.8 kg)     Height 11/17/17 0059 5\' 10"  (1.778 m)     Head Circumference --      Peak Flow --      Pain Score --      Pain Loc --      Pain Edu? --      Excl. in GC? --     Constitutional: Alert and oriented. Well appearing and in no acute distress. Eyes: Conjunctivae are normal. PERRL. EOMI. Head: Atraumatic. Nose: No congestion/rhinnorhea. Mouth/Throat: Mucous membranes are moist.  Oropharynx non-erythematous. Cardiovascular: Normal rate, regular rhythm. Grossly normal heart sounds.  Good peripheral circulation. Respiratory: Normal respiratory effort.  No retractions. Lungs CTAB. Gastrointestinal: Soft and nontender. No distention.  Positive bowel sounds. Genitourinary: No vaginal discharge, no foreign body noted in the vagina. Rectal: Digital rectal exam performed, stool felt in the vault with no other paraphernalia Musculoskeletal: No lower extremity tenderness nor edema.   Neurologic:  Normal speech and language. . Skin:  Skin is warm, dry and intact.  Bruising noted to wrists Psychiatric: Mood and affect are normal.   ____________________________________________   LABS (all labs ordered are listed, but only abnormal results are displayed)  Labs Reviewed - No data to display ____________________________________________  EKG  none ____________________________________________  RADIOLOGY  ED MD interpretation:  none  Official radiology report(s): No results found.  ____________________________________________   PROCEDURES  Procedure(s) performed: None  Procedures  Critical Care performed: No  ____________________________________________   INITIAL IMPRESSION / ASSESSMENT AND PLAN / ED COURSE  As part of my medical decision making, I reviewed the following data within the electronic MEDICAL RECORD NUMBER Notes  from prior ED visits and [place] Controlled Substance Database   This is a 37 year old female who was brought into the hospital today for a cavity search looking for drugs.  The patient did consent to the speculum exam a digital rectal exam.  I did not discover anything on the speculum exam and I did not feel any abnormal paraphernalia on the digital rectal exam.  The patient can have her stool evaluated for the next 2 days to see if she does have a bowel movement that includes drugs.  Otherwise the patient will be discharged in the custody of the police to go to jail.      ____________________________________________   FINAL CLINICAL IMPRESSION(S) / ED DIAGNOSES  Final diagnoses:  Foreign body in vagina, initial encounter  Foreign body of rectum, initial encounter     ED Discharge Orders    None       Note:  This document was prepared using Dragon voice recognition software and may include unintentional dictation errors.  Rebecka Apley, MD 11/17/17 [phone]

## 2017-11-17 NOTE — ED Notes (Addendum)
Pt resting comfortably in bed. [employer] officer at bedside

## 2017-11-17 NOTE — ED Notes (Signed)
Pt is under arrest and her for a cavity search r/t drug possession

## 2017-11-23 ENCOUNTER — Inpatient Hospital Stay (HOSPITAL_COMMUNITY): Payer: Self-pay

## 2017-11-23 ENCOUNTER — Inpatient Hospital Stay: Payer: Self-pay

## 2017-11-23 ENCOUNTER — Inpatient Hospital Stay
Admission: EM | Admit: 2017-11-23 | Discharge: 2017-11-24 | DRG: 101 | Disposition: A | Discharge status: Home / Self Care | Payer: Self-pay | Attending: Internal Medicine | Admitting: Internal Medicine

## 2017-11-23 DIAGNOSIS — Z79899 Other long term (current) drug therapy: Secondary | ICD-10-CM

## 2017-11-23 DIAGNOSIS — Z792 Long term (current) use of antibiotics: Secondary | ICD-10-CM

## 2017-11-23 DIAGNOSIS — N390 Urinary tract infection, site not specified: Secondary | ICD-10-CM | POA: Diagnosis present

## 2017-11-23 DIAGNOSIS — B182 Chronic viral hepatitis C: Secondary | ICD-10-CM | POA: Diagnosis present

## 2017-11-23 DIAGNOSIS — R740 Nonspecific elevation of levels of transaminase and lactic acid dehydrogenase [LDH]: Secondary | ICD-10-CM | POA: Diagnosis present

## 2017-11-23 DIAGNOSIS — Z8659 Personal history of other mental and behavioral disorders: Secondary | ICD-10-CM

## 2017-11-23 DIAGNOSIS — R569 Unspecified convulsions: Principal | ICD-10-CM | POA: Diagnosis present

## 2017-11-23 DIAGNOSIS — N179 Acute kidney failure, unspecified: Secondary | ICD-10-CM | POA: Diagnosis present

## 2017-11-23 DIAGNOSIS — R402413 Glasgow coma scale score 13-15, at hospital admission: Secondary | ICD-10-CM | POA: Diagnosis present

## 2017-11-23 DIAGNOSIS — F11188 Opioid abuse with other opioid-induced disorder: Secondary | ICD-10-CM | POA: Diagnosis present

## 2017-11-23 DIAGNOSIS — E669 Obesity, unspecified: Secondary | ICD-10-CM | POA: Diagnosis present

## 2017-11-23 DIAGNOSIS — Z6832 Body mass index (BMI) 32.0-32.9, adult: Secondary | ICD-10-CM

## 2017-11-23 DIAGNOSIS — Z653 Problems related to other legal circumstances: Secondary | ICD-10-CM

## 2017-11-23 DIAGNOSIS — F17200 Nicotine dependence, unspecified, uncomplicated: Secondary | ICD-10-CM | POA: Diagnosis present

## 2017-11-23 DIAGNOSIS — E860 Dehydration: Secondary | ICD-10-CM | POA: Diagnosis present

## 2017-11-23 LAB — URINALYSIS, COMPLETE (UACMP) WITH MICROSCOPIC
Bilirubin Urine: NEGATIVE
Glucose, UA: NEGATIVE mg/dL
Hgb urine dipstick: NEGATIVE
KETONES UR: 20 mg/dL — AB
Leukocytes, UA: NEGATIVE
Nitrite: NEGATIVE
PH: 5 (ref 5.0–8.0)
Protein, ur: 30 mg/dL — AB
SPECIFIC GRAVITY, URINE: 1.013 (ref 1.005–1.030)

## 2017-11-23 LAB — BASIC METABOLIC PANEL
ANION GAP: 17 — AB (ref 5–15)
BUN: 26 mg/dL — ABNORMAL HIGH (ref 6–20)
CO2: 21 mmol/L — ABNORMAL LOW (ref 22–32)
Calcium: 9.9 mg/dL (ref 8.9–10.3)
Chloride: 104 mmol/L (ref 101–111)
Creatinine, Ser: 1.17 mg/dL — ABNORMAL HIGH (ref 0.44–1.00)
GFR calc Af Amer: >60 mL/min (ref >60)
GFR, EST NON AFRICAN AMERICAN: 59 mL/min — AB (ref >60)
GLUCOSE: 100 mg/dL — AB (ref 65–99)
POTASSIUM: 3.5 mmol/L (ref 3.5–5.1)
Sodium: 142 mmol/L (ref 135–145)

## 2017-11-23 LAB — CBC WITH DIFFERENTIAL/PLATELET
BASOS ABS: 0.2 10*3/uL — AB (ref 0–0.1)
Basophils Relative: 1 %
Eosinophils Absolute: 0 10*3/uL (ref 0–0.7)
Eosinophils Relative: 0 %
HEMATOCRIT: 45.2 % (ref 35.0–47.0)
Hemoglobin: 14.7 g/dL (ref 12.0–16.0)
LYMPHS PCT: 10 %
Lymphs Abs: 2.1 10*3/uL (ref 1.0–3.6)
MCH: 25.1 pg — ABNORMAL LOW (ref 26.0–34.0)
MCHC: 32.5 g/dL (ref 32.0–36.0)
MCV: 77.2 fL — AB (ref 80.0–100.0)
MONO ABS: 1.1 10*3/uL — AB (ref 0.2–0.9)
Monocytes Relative: 6 %
NEUTROS ABS: 16.9 10*3/uL — AB (ref 1.4–6.5)
NEUTROS PCT: 83 %
Platelets: 359 10*3/uL (ref 150–440)
RBC: 5.86 MIL/uL — AB (ref 3.80–5.20)
RDW: 15.8 % — ABNORMAL HIGH (ref 11.5–14.5)
WBC: 20.3 10*3/uL — AB (ref 3.6–11.0)

## 2017-11-23 LAB — URINE DRUG SCREEN, QUALITATIVE (ARMC ONLY)
Amphetamines, Ur Screen: NOT DETECTED
Barbiturates, Ur Screen: NOT DETECTED
Benzodiazepine, Ur Scrn: POSITIVE — AB
Cannabinoid 50 Ng, Ur ~~LOC~~: NOT DETECTED
Cocaine Metabolite,Ur ~~LOC~~: NOT DETECTED
MDMA (Ecstasy)Ur Screen: NOT DETECTED
Methadone Scn, Ur: NOT DETECTED
Opiate, Ur Screen: NOT DETECTED
Phencyclidine (PCP) Ur S: NOT DETECTED
Tricyclic, Ur Screen: NOT DETECTED

## 2017-11-23 LAB — MRSA PCR SCREENING: MRSA by PCR: NEGATIVE

## 2017-11-23 LAB — LACTIC ACID, PLASMA
Lactic Acid, Venous: 2.7 mmol/L (ref 0.5–1.9)
Lactic Acid, Venous: 1 mmol/L (ref 0.5–1.9)

## 2017-11-23 LAB — AMMONIA: Ammonia: <9 umol/L — ABNORMAL LOW (ref 9–35)

## 2017-11-23 LAB — TSH: TSH: 1.151 u[IU]/mL (ref 0.350–4.500)

## 2017-11-23 MED ORDER — INFLUENZA VAC SPLIT QUAD 0.5 ML IM SUSY: 0.5000 mL | PREFILLED_SYRINGE | INTRAMUSCULAR | Status: DC

## 2017-11-23 MED ORDER — LORAZEPAM 2 MG/ML IJ SOLN
0.0000 mg | Freq: Four times a day (QID) | INTRAMUSCULAR | Status: DC
  Administered 2017-11-23 (×3): 2 mg via INTRAVENOUS
  Filled 2017-11-23 (×4): qty 1

## 2017-11-23 MED ORDER — LORAZEPAM 2 MG/ML IJ SOLN
INTRAMUSCULAR | Status: AC
  Administered 2017-11-23: 2 mg via INTRAMUSCULAR
  Filled 2017-11-23: qty 1

## 2017-11-23 MED ORDER — SODIUM CHLORIDE 0.9 % IV BOLUS (SEPSIS)
1000.0000 mL | Freq: Once | INTRAVENOUS | Status: AC
  Administered 2017-11-23: 1000 mL via INTRAVENOUS

## 2017-11-23 MED ORDER — FOLIC ACID 1 MG PO TABS
1.0000 mg | ORAL_TABLET | Freq: Every day | ORAL | Status: DC
  Administered 2017-11-24: 1 mg via ORAL
  Filled 2017-11-23: qty 1

## 2017-11-23 MED ORDER — ENOXAPARIN SODIUM 40 MG/0.4ML ~~LOC~~ SOLN
40.0000 mg | SUBCUTANEOUS | Status: DC
  Administered 2017-11-23 – 2017-11-24 (×2): 40 mg via SUBCUTANEOUS
  Filled 2017-11-23 (×2): qty 0.4

## 2017-11-23 MED ORDER — ACETAMINOPHEN 325 MG PO TABS: 650.0000 mg | ORAL_TABLET | Freq: Four times a day (QID) | ORAL | Status: DC | PRN

## 2017-11-23 MED ORDER — ONDANSETRON HCL 4 MG/2ML IJ SOLN: 4.0000 mg | Freq: Four times a day (QID) | INTRAMUSCULAR | Status: DC | PRN

## 2017-11-23 MED ORDER — VITAMIN B-1 100 MG PO TABS
100.0000 mg | ORAL_TABLET | Freq: Every day | ORAL | Status: DC
  Administered 2017-11-24: 100 mg via ORAL
  Filled 2017-11-23: qty 1

## 2017-11-23 MED ORDER — LORAZEPAM 1 MG PO TABS: 1.0000 mg | ORAL_TABLET | Freq: Four times a day (QID) | ORAL | Status: DC | PRN

## 2017-11-23 MED ORDER — LORAZEPAM 2 MG/ML IJ SOLN
2.0000 mg | Freq: Once | INTRAMUSCULAR | Status: AC
  Administered 2017-11-23: 2 mg via INTRAVENOUS
  Filled 2017-11-23: qty 1

## 2017-11-23 MED ORDER — LORAZEPAM 2 MG/ML IJ SOLN: 0.0000 mg | Freq: Two times a day (BID) | INTRAMUSCULAR | Status: DC

## 2017-11-23 MED ORDER — SODIUM CHLORIDE 0.9 % IV SOLN
1000.0000 mg | Freq: Once | INTRAVENOUS | Status: AC
  Administered 2017-11-23: 1000 mg via INTRAVENOUS
  Filled 2017-11-23: qty 10

## 2017-11-23 MED ORDER — LORAZEPAM 2 MG/ML IJ SOLN: 2.0000 mg | Freq: Once | INTRAMUSCULAR | Status: DC

## 2017-11-23 MED ORDER — LORAZEPAM 2 MG/ML IJ SOLN: 1.0000 mg | Freq: Four times a day (QID) | INTRAMUSCULAR | Status: DC | PRN

## 2017-11-23 MED ORDER — SODIUM CHLORIDE 0.9 % IV SOLN
INTRAVENOUS | Status: DC
  Administered 2017-11-23 – 2017-11-24 (×4): via INTRAVENOUS

## 2017-11-23 MED ORDER — ACETAMINOPHEN 650 MG RE SUPP: 650.0000 mg | Freq: Four times a day (QID) | RECTAL | Status: DC | PRN

## 2017-11-23 MED ORDER — ONDANSETRON HCL 4 MG PO TABS: 4.0000 mg | ORAL_TABLET | Freq: Four times a day (QID) | ORAL | Status: DC | PRN

## 2017-11-23 MED ORDER — ADULT MULTIVITAMIN W/MINERALS CH
1.0000 | ORAL_TABLET | Freq: Every day | ORAL | Status: DC
  Administered 2017-11-24: 1 via ORAL
  Filled 2017-11-23: qty 1

## 2017-11-23 MED ORDER — LORAZEPAM 2 MG/ML IJ SOLN
2.0000 mg | Freq: Once | INTRAMUSCULAR | Status: AC
  Administered 2017-11-23: 2 mg via INTRAMUSCULAR

## 2017-11-23 NOTE — ED Provider Notes (Signed)
[facility] Emergency Department Provider Note   First MD Initiated Contact with Patient 11/23/17 [phone]     (approximate)  I have reviewed the triage vital signs and the nursing notes.  Level 5 caveat history limited secondary to altered mental status HISTORY  Chief Complaint Seizures    HPI Jasmine [name] is a 37 y.o. female with history of polysubstance abuse including alcohol and opiates presents to the emergency department correctional officers custody with history of 3 witnessed seizure-like activity today while at jail.  Correctional officers at bedside states that they witnessed one sort of seizure-like activity which lasted approximately 2-3 minutes.  Patient with nonsensical speech at this time.   Past Medical History:  Diagnosis Date  . Hep C w/o coma, chronic (HCC)   . Substance abuse [facility])     Patient Active Problem List   Diagnosis Date Noted  . Seizures (HCC) 11/23/2017  . Heroin use disorder, moderate, dependence (HCC) 02/24/2015  . Opioid dependence with intoxication delirium (HCC)   . Polysubstance abuse [facility])     Past Surgical History:  Procedure Laterality Date  . KNEE ARTHROCENTESIS Left   . TUBAL LIGATION N/A     Prior to Admission medications   Medication Sig Start Date End Date Taking? Authorizing Provider  ibuprofen (ADVIL,MOTRIN) 800 MG tablet Take 1 tablet (800 mg total) by mouth every 8 (eight) hours as needed for moderate pain. 06/04/15   Joni Reining, PA-C  ketorolac (TORADOL) 10 MG tablet Take 1 tablet (10 mg total) by mouth every 6 (six) hours as needed. For pain Patient not taking: Reported on 01/06/2015 06/06/14   Linna Hoff, MD  naloxone [facility]) 0.4 MG/ML injection Inject 1 mL (0.4 mg total) into the skin as needed. 05/30/15   Clapacs, Jackquline Denmark, MD  naproxen (NAPROSYN) 500 MG tablet Take 1 tablet (500 mg total) by mouth 2 (two) times daily. Patient not taking: Reported on 01/06/2015 05/30/14   Felicie Morn, NP    oxyCODONE (ROXICODONE) 5 MG immediate release tablet Take 1 tablet (5 mg total) by mouth every 4 (four) hours as needed for severe pain. Patient not taking: Reported on 01/06/2015 05/30/14   Felicie Morn, NP  penicillin v potassium (VEETID) 500 MG tablet Take 1 tablet (500 mg total) by mouth 3 (three) times daily. Patient not taking: Reported on 01/06/2015 05/30/14   Felicie Morn, NP  QUEtiapine (SEROQUEL) 50 MG tablet Take 50 mg by mouth 2 (two) times daily.     [provider]  sulfamethoxazole-trimethoprim (BACTRIM DS,SEPTRA DS) 800-160 MG per tablet Take 1 tablet by mouth 2 (two) times daily. 06/04/15   Joni Reining, PA-C  traMADol (ULTRAM) 50 MG tablet Take 1 tablet (50 mg total) by mouth every 6 (six) hours as needed for moderate pain. 06/04/15   Joni Reining, PA-C    Allergies No known drug allergies No family history on file.  Social History Social History   Tobacco Use  . Smoking status: Current Every Day Smoker  . Smokeless tobacco: Never Used  Substance Use Topics  . Alcohol use: Yes    Comment: occ  . Drug use: Yes    Types: IV    Comment: heroin    Review of Systems Constitutional: No fever/chills Eyes: No visual changes. ENT: No sore throat. Cardiovascular: Denies chest pain. Respiratory: Denies shortness of breath. Gastrointestinal: No abdominal pain.  No nausea, no vomiting.  No diarrhea.  No constipation. Genitourinary: Negative for dysuria.  Musculoskeletal: Negative for neck pain.  Negative for back pain. Integumentary: Negative for rash. Neurological: Positive for seizure-like activity ____________________________________________   PHYSICAL EXAM:  VITAL SIGNS: ED Triage Vitals [11/23/17 0520]  Enc Vitals Group     BP (!) 158/129     Pulse Rate 76     Resp (!) 21     Temp 98 F (36.7 C)     Temp Source Oral     SpO2 97 %     Weight 99.8 kg (220 lb)     Height 1.753 m (5\' 9" )     Head Circumference      Peak Flow      Pain Score       Pain Loc      Pain Edu?      Excl. in GC?     Constitutional: Confused with nonsensical speech.Restless Eyes: Conjunctivae are normal. PERRL. EOMI. Head: Atraumatic. Ears:  Healthy appearing ear canals and TMs bilaterally Nose: No congestion/rhinnorhea. Mouth/Throat: Mucous membranes are moist.  Oropharynx non-erythematous. Neck: No stridor.  No meningeal signs.  Cardiovascular: Normal rate, regular rhythm. Good peripheral circulation. Grossly normal heart sounds. Respiratory: Normal respiratory effort.  No retractions. Lungs CTAB. Gastrointestinal: Soft and nontender. No distention.  Musculoskeletal: No lower extremity tenderness nor edema. No gross deformities of extremities. Neurologic: Nonsensical speech. No gross focal neurologic deficits are appreciated.  Skin:  Skin is warm, dry and intact. No rash noted. Psychiatric: Mood and affect are normal. Speech and behavior are normal.  ____________________________________________   LABS (all labs ordered are listed, but only abnormal results are displayed)  Labs Reviewed  BASIC METABOLIC PANEL - Abnormal; Notable for the following components:      Result Value   CO2 21 (*)    Glucose, Bld 100 (*)    BUN 26 (*)    Creatinine, Ser 1.17 (*)    GFR calc non Af Amer 59 (*)    Anion gap 17 (*)    All other components within normal limits  CBC WITH DIFFERENTIAL/PLATELET - Abnormal; Notable for the following components:   WBC 20.3 (*)    RBC 5.86 (*)    MCV 77.2 (*)    MCH 25.1 (*)    RDW 15.8 (*)    Neutro Abs 16.9 (*)    Monocytes Absolute 1.1 (*)    Basophils Absolute 0.2 (*)    All other components within normal limits  AMMONIA - Abnormal; Notable for the following components:   Ammonia <9 (*)    All other components within normal limits  LACTIC ACID, PLASMA - Abnormal; Notable for the following components:   Lactic Acid, Venous 2.7 (*)    All other components within normal limits  URINE DRUG SCREEN, QUALITATIVE ([facility]  ONLY)  LACTIC ACID, PLASMA  CBG MONITORING, ED  POC URINE PREG, ED   ____________________________________________  EKG   ED ECG REPORT I, [place] N BROWN, the attending physician, personally viewed and interpreted this ECG.   Date: 11/23/2017  EKG Time: 5:19 AM  Rate: 69  Rhythm: Normal sinus rhythm  Axis: Normal  Intervals: Normal  ST&T Change: None  _________________ ____________________________________________   PROCEDURES  Critical Care performed: CRITICAL CARE Performed by: Darci CurrentANDOLPH N BROWN   Total critical care time: 45 minutes  Critical care time was exclusive of separately billable procedures and treating other patients.  Critical care was necessary to treat or prevent imminent or life-threatening deterioration.  Critical care was time spent personally by me on  the following activities: development of treatment plan with patient and/or surrogate as well as nursing, discussions with consultants, evaluation of patient's response to treatment, examination of patient, obtaining history from patient or surrogate, ordering and performing treatments and interventions, ordering and review of laboratory studies, ordering and review of radiographic studies, pulse oximetry and re-evaluation of patient's condition.  Procedures   ____________________________________________   INITIAL IMPRESSION / ASSESSMENT AND PLAN / ED COURSE  As part of my medical decision making, I reviewed the following data within the electronic MEDICAL RECORD NUMBER89 year old female presented with above-stated history of witnessed seizure-like activity while in jail today.  Question if this is epilepsy related versus alcohol withdrawal seizures.  Patient given IM Ativan 2 mg on arrival to the emergency department while IV access was attempting to be obtained.  Nonsensical speech persisted following IM Ativan patient very restless and agitated at this time and as such 2 mg of IV Ativan was administered.   In addition patient was given 1 g of IV Keppra.  Patient discussed with Dr. Sheryle Hail for hospital admission for further evaluation and management for seizure activity.  Of note laboratory data notable for an L of white blood cell count of 20.3 and a lactic acid of 2.7 I suspect these labs abnormalities to be secondary to the fact that the patient had 3 seizures before arrival to the.  As such following IV hydration I would recommend obtaining a repeat CBC and lactate. ____________________________________________  FINAL CLINICAL IMPRESSION(S) / ED DIAGNOSES  Final diagnoses:  Seizure (HCC)     MEDICATIONS GIVEN DURING THIS VISIT:  Medications  levETIRAcetam (KEPPRA) 1,000 mg in sodium chloride 0.9 % 100 mL IVPB (1,000 mg Intravenous New Bag/Given 11/23/17 0713)  LORazepam (ATIVAN) injection 2 mg (2 mg Intramuscular Given 11/23/17 0554)  sodium chloride 0.9 % bolus 1,000 mL (1,000 mLs Intravenous New Bag/Given 11/23/17 0643)  sodium chloride 0.9 % bolus 1,000 mL (1,000 mLs Intravenous New Bag/Given 11/23/17 1610)     ED Discharge Orders    None       Note:  This document was prepared using Dragon voice recognition software and may include unintentional dictation errors.    Darci Current, MD 11/23/17 [phone]

## 2017-11-23 NOTE — ED Notes (Signed)
CRITICAL LAB: LACTIC is 2.7, [name], Lab, Dr. Manson PasseyBrown notified, orders received

## 2017-11-23 NOTE — ED Notes (Signed)
Pharm called for meds  

## 2017-11-23 NOTE — ED Triage Notes (Signed)
[facility] of witnessed siezures

## 2017-11-23 NOTE — Consult Note (Signed)
Reason for Consult:Seizures Referring Physician: Elisabeth Pigeon  CC: Seizures  HPI: Jasmine [name] is an 37 y.o. female who is currently confused.  Unable to provide any history therefore all history obtained from the chart.  It appears that the patient was incarcerated about 5 days ago.  Has a history of drug and alcohol abuse.  Reports that she has no history of seizures but unclear if this is accurate.  Do not see anticonvulsants in her medication history.  Was at the jail today and noted to have multiple seizures.  Patient brought to the ED for further evaluation.  Patient awakened on multiple occasions in the ED, screaming.    Past Medical History:  Diagnosis Date  . Hep C w/o coma, chronic (HCC)   . Substance abuse [facility])     Past Surgical History:  Procedure Laterality Date  . KNEE ARTHROCENTESIS Left   . TUBAL LIGATION N/A     Family history: Father with Crohn's Disease- deceased.  Mother alive and well.    Social History:  reports that she has been smoking.  she has never used smokeless tobacco. She reports that she drinks alcohol. She reports that she uses drugs. Drug: IV.  No Known Allergies  Medications:  I have reviewed the patient's current medications. Prior to Admission:  Medications Prior to Admission  Medication Sig Dispense Refill Last Dose  . ibuprofen (ADVIL,MOTRIN) 800 MG tablet Take 1 tablet (800 mg total) by mouth every 8 (eight) hours as needed for moderate pain. 15 tablet 0   . ketorolac (TORADOL) 10 MG tablet Take 1 tablet (10 mg total) by mouth every 6 (six) hours as needed. For pain (Patient not taking: Reported on 01/06/2015) 20 tablet 1 Not Taking at Unknown time  . naloxone (NARCAN) 0.4 MG/ML injection Inject 1 mL (0.4 mg total) into the skin as needed. 1 mL 1   . naproxen (NAPROSYN) 500 MG tablet Take 1 tablet (500 mg total) by mouth 2 (two) times daily. (Patient not taking: Reported on 01/06/2015) 20 tablet 0 Not Taking at Unknown time  . oxyCODONE  (ROXICODONE) 5 MG immediate release tablet Take 1 tablet (5 mg total) by mouth every 4 (four) hours as needed for severe pain. (Patient not taking: Reported on 01/06/2015) 30 tablet 0 Not Taking at Unknown time  . penicillin v potassium (VEETID) 500 MG tablet Take 1 tablet (500 mg total) by mouth 3 (three) times daily. (Patient not taking: Reported on 01/06/2015) 30 tablet 0 Not Taking at Unknown time  . QUEtiapine (SEROQUEL) 50 MG tablet Take 50 mg by mouth 2 (two) times daily.    unknown at unknown  . sulfamethoxazole-trimethoprim (BACTRIM DS,SEPTRA DS) 800-160 MG per tablet Take 1 tablet by mouth 2 (two) times daily. 20 tablet 0   . traMADol (ULTRAM) 50 MG tablet Take 1 tablet (50 mg total) by mouth every 6 (six) hours as needed for moderate pain. 12 tablet 0    Scheduled: . enoxaparin (LOVENOX) injection  40 mg Subcutaneous Q24H  . folic acid  1 mg Oral Daily  . LORazepam  0-4 mg Intravenous Q6H   Followed by  . [START ON 11/25/2017] LORazepam  0-4 mg Intravenous Q12H  . multivitamin with minerals  1 tablet Oral Daily  . thiamine  100 mg Oral Daily    ROS: Unable to provide  Physical Examination: Blood pressure 132/84, pulse 70, temperature 98 F (36.7 C), temperature source Oral, resp. rate 20, height 5\' 9"  (1.753 m), weight 99.8 kg (  220 lb), last menstrual period 11/15/2017, SpO2 100 %.  HEENT-  Normocephalic, no lesions, without obvious abnormality.  Normal external eye and conjunctiva.  Normal TM's bilaterally.  Normal auditory canals and external ears. Normal external nose, mucus membranes and septum.  Normal pharynx. Cardiovascular- S1, S2 normal, pulses palpable throughout   Lungs- chest clear, no wheezing, rales, normal symmetric air entry Abdomen- soft, non-tender; bowel sounds normal; no masses,  no organomegaly Extremities- no edema Lymph-no adenopathy palpable Musculoskeletal-no joint tenderness, deformity or swelling Skin-multiple areas of bruising  Neurological  Examination   Mental Status: Lethargic but able to be alerted.  Speech markedly dysarthric and often unable to be understood.  Follows simple commands.  Easily agitated.   Cranial Nerves: II: Blinks to bilateral confrontation, pupils equal, round, reactive to light and accommodation III,IV, VI: ptosis not present, extra-ocular motions grossly intact bilaterally V,VII: smile symmetric, facial light touch sensation normal bilaterally VIII: hearing normal bilaterally IX,X: gag reflex present XI: bilateral shoulder shrug XII: midline tongue extension Motor: Moves all extremities strongly against gravity.  NO formal testing able to be performed.  No focal weakness noted.   Sensory: Responds to light noxious stimuli throughout Deep Tendon Reflexes: 2+ and symmetric throughout Plantars: Right: upgoing   Left: upgoing Cerebellar: Unable to perform due to cooperation Gait: not tested due to safety concerns    Laboratory Studies:   Basic Metabolic Panel: Recent Labs  Lab 11/23/17 0553  NA 142  K 3.5  CL 104  CO2 21*  GLUCOSE 100*  BUN 26*  CREATININE 1.17*  CALCIUM 9.9    Liver Function Tests: No results for input(s): AST, ALT, ALKPHOS, BILITOT, PROT, ALBUMIN in the last 168 hours. No results for input(s): LIPASE, AMYLASE in the last 168 hours. Recent Labs  Lab 11/23/17 0600  AMMONIA <9*    CBC: Recent Labs  Lab 11/23/17 0553  WBC 20.3*  NEUTROABS 16.9*  HGB 14.7  HCT 45.2  MCV 77.2*  PLT 359    Cardiac Enzymes: No results for input(s): CKTOTAL, CKMB, CKMBINDEX, TROPONINI in the last 168 hours.  BNP: Invalid input(s): POCBNP  CBG: No results for input(s): GLUCAP in the last 168 hours.  Microbiology: Results for orders placed or performed during the hospital encounter of 11/23/17  MRSA PCR Screening     Status: None   Collection Time: 11/23/17 10:45 AM  Result Value Ref Range Status   MRSA by PCR NEGATIVE NEGATIVE Final    Comment:        The GeneXpert  MRSA Assay (FDA approved for NASAL specimens only), is one component of a comprehensive MRSA colonization surveillance program. It is not intended to diagnose MRSA infection nor to guide or monitor treatment for MRSA infections. Performed at [facility], [address] [place], [place] [id number]     Coagulation Studies: No results for input(s): LABPROT, INR in the last 72 hours.  Urinalysis: No results for input(s): COLORURINE, LABSPEC, PHURINE, GLUCOSEU, HGBUR, BILIRUBINUR, KETONESUR, PROTEINUR, UROBILINOGEN, NITRITE, LEUKOCYTESUR in the last 168 hours.  Invalid input(s): APPERANCEUR  Lipid Panel:  No results found for: CHOL, TRIG, HDL, CHOLHDL, VLDL, LDLCALC  HgbA1C: No results found for: HGBA1C  Urine Drug Screen:      Component Value Date/Time   LABOPIA POSITIVE (A) 05/29/2015 2235   LABOPIA POSITIVE (A) 01/06/2015 2310   COCAINSCRNUR POSITIVE (A) 05/29/2015 2235   LABBENZ NONE DETECTED 05/29/2015 2235   LABBENZ POSITIVE (A) 01/06/2015 2310   AMPHETMU NONE DETECTED 05/29/2015 2235  AMPHETMU NONE DETECTED 01/06/2015 2310   THCU POSITIVE (A) 05/29/2015 2235   THCU NONE DETECTED 01/06/2015 2310   LABBARB NONE DETECTED 05/29/2015 2235   LABBARB NONE DETECTED 01/06/2015 2310    Alcohol Level: No results for input(s): ETH in the last 168 hours.  Other results: EKG: sinus rhythm at 69 bpm.  Imaging: Ct Head Wo Contrast  Result Date: 11/23/2017 CLINICAL DATA:  Witnessed seizures. Dysarthria. Polysubstance abuse. EXAM: CT HEAD WITHOUT CONTRAST TECHNIQUE: Contiguous axial images were obtained from the base of the skull through the vertex without intravenous contrast. COMPARISON:  Report from MRI brain from 11/29/1998 FINDINGS: Brain: Dilated Virchow-Robin space along the inferior margin of the right lentiform nucleus, as reported on prior brain MRI dated 11/29/1998. Otherwise, The brainstem, cerebellum, cerebral peduncles, thalami, basal ganglia, basilar  cisterns, and ventricular system appear within normal limits. No intracranial hemorrhage, mass lesion, or acute CVA. Vascular: Unremarkable Skull: Unremarkable Sinuses/Orbits: Unremarkable Other: No supplemental non-categorized findings. IMPRESSION: 1. No significant intracranial abnormality is identified. 2. Incidental chronic dilated perivascular space along the inferior margin of the right lentiform nucleus. Electronically Signed   By: Gaylyn Rong M.D.   On: 11/23/2017 08:03     Assessment/Plan: 37 year old female with a history of ETOH and drug abuse who presents after multiple seizures while incarcerated.  UDS pending.  Head CT reviewed and shows no acute changes.  Per old records no history of seizures.  This presentation may very well represent withdrawal seizures.  Neurological examination is nonfocal.    Recommendations: 1. Agree with CIWA protocol 2. MRI of the brain with and without contrast once patient more cooperative 3. Agree with IV vitamin supplementation 4. Seizure precautions 5. Ativan prn seizure activity 6. EEG   Thana Farr, MD Neurology [phone] 11/23/2017, 2:12 PM

## 2017-11-23 NOTE — Progress Notes (Signed)
[facility] [place] [facility]   PATIENT NAME: Jasmine [name]    MR#:  [id number]  DATE OF BIRTH:  01/12/1981  SUBJECTIVE:  CHIEF COMPLAINT:   Chief Complaint  Patient presents with  . Seizures    Patient is recently incarcerated last week. Brought in with seizures. Completely confused so not able to give any further details.  REVIEW OF SYSTEMS:   Patient is confused. ROS  DRUG ALLERGIES:  No Known Allergies  VITALS:  Blood pressure 132/84, pulse 70, temperature 98 F (36.7 C), temperature source Oral, resp. rate 20, height 5\' 9"  (1.753 m), weight 99.8 kg (220 lb), last menstrual period 11/15/2017, SpO2 100 %.  PHYSICAL EXAMINATION:  GENERAL:  37 y.o.-year-old patient lying in the bed with no acute distress. Confused, constantly mumbling but not making any sense. EYES: Pupils equal, round, reactive to light and accommodation. No scleral icterus. Extraocular muscles intact.  HEENT: Head atraumatic, normocephalic. Oropharynx and nasopharynx clear.  NECK:  Supple, no jugular venous distention. No thyroid enlargement, no tenderness.  LUNGS: Normal breath sounds bilaterally, no wheezing, rales,rhonchi or crepitation. No use of accessory muscles of respiration.  CARDIOVASCULAR: S1, S2 normal. No murmurs, rubs, or gallops.  ABDOMEN: Soft, nontender, nondistended. Bowel sounds present. No organomegaly or mass.  EXTREMITIES: No pedal edema, cyanosis, or clubbing.  NEUROLOGIC: Patient is disoriented, constantly mumbling and making some stories but does not communicate well, does not follow, and but spontaneously moving all 4 limbs. Does not participate properly in examination. PSYCHIATRIC: The patient is alert and disoriented x 3.  SKIN: No obvious rash, lesion, or ulcer.   Physical Exam LABORATORY PANEL:   CBC Recent Labs  Lab 11/23/17 0553  WBC 20.3*  HGB 14.7  HCT 45.2  PLT 359    ------------------------------------------------------------------------------------------------------------------  Chemistries  Recent Labs  Lab 11/23/17 0553  NA 142  K 3.5  CL 104  CO2 21*  GLUCOSE 100*  BUN 26*  CREATININE 1.17*  CALCIUM 9.9   ------------------------------------------------------------------------------------------------------------------  Cardiac Enzymes No results for input(s): TROPONINI in the last 168 hours. ------------------------------------------------------------------------------------------------------------------  RADIOLOGY:  Ct Head Wo Contrast  Result Date: 11/23/2017 CLINICAL DATA:  Witnessed seizures. Dysarthria. Polysubstance abuse. EXAM: CT HEAD WITHOUT CONTRAST TECHNIQUE: Contiguous axial images were obtained from the base of the skull through the vertex without intravenous contrast. COMPARISON:  Report from MRI brain from 11/29/1998 FINDINGS: Brain: Dilated Virchow-Robin space along the inferior margin of the right lentiform nucleus, as reported on prior brain MRI dated 11/29/1998. Otherwise, The brainstem, cerebellum, cerebral peduncles, thalami, basal ganglia, basilar cisterns, and ventricular system appear within normal limits. No intracranial hemorrhage, mass lesion, or acute CVA. Vascular: Unremarkable Skull: Unremarkable Sinuses/Orbits: Unremarkable Other: No supplemental non-categorized findings. IMPRESSION: 1. No significant intracranial abnormality is identified. 2. Incidental chronic dilated perivascular space along the inferior margin of the right lentiform nucleus. Electronically Signed   By: Gaylyn RongWalter  Liebkemann M.D.   On: 11/23/2017 08:03    ASSESSMENT AND PLAN:   Active Problems:   Seizures (HCC)  This is a 37 year old female admitted for seizure activity. 1.  Altered mental status    Seizure: The patient has a history of opiate abuse and/or alcohol abuse.   for last 5 days in prison, so this could be due to withdrawal.  on  CIWA protocol.  Ativan as needed for agitation.  Consult neurology for further input.   No signs of infection yet, urinalysis and urine for drug toxicology is still need to be collected. The  patient does not have fever.   Diagnoses are high, this could be secondary to her seizures.   I will rely on neurologist to decide if we need to do a lumbar puncture on her or not. 2.  Acute kidney injury: Prerenal; the patient is clinically dehydrated.  Hydrate with intravenous fluid.  Avoid nephrotoxic agents. 3.  Opiate abuse: Monitor for signs or symptoms of active withdrawal. 4.  Obesity: BMI 32; encouraged healthy diet and exercise 5.  DVT prophylaxis: Lovenox 6.  GI prophylaxis: none   All the records are reviewed and case discussed with Care Management/Social Workerr. Management plans discussed with the patient, family and they are in agreement.  CODE STATUS: full.  TOTAL TIME TAKING CARE OF THIS PATIENT: 35 minutes.     POSSIBLE D/C IN 1-2 DAYS, DEPENDING ON CLINICAL CONDITION.   Altamese Dilling M.D on 11/23/2017   Between 7am to 6pm - Pager - [phone]  After 6pm go to [email or web address] - password [email or web address]  [facility]  Office  [phone]  CC: Primary care physician; Patient, No Pcp Per  Note: This dictation was prepared with Dragon dictation along with smaller phrase technology. Any transcriptional errors that result from this process are unintentional.

## 2017-11-23 NOTE — H&P (Signed)
Jasmine [name] is an 37 y.o. female.   Chief Complaint: Seizures HPI: The patient with past medical history of substance abuse as well as hep C presents to the emergency department from jail after multiple seizures today.  It is unclear if the patient has seizure disorder or if her seizures have been precipitated by withdrawal or illicit substance.  The patient cannot provide history aside from reporting that she has awaken multiple times today screaming.  This cannot be verified.  The rest of the history the patient provides is incoherent.  Laboratory evaluation in the emergency department showed elevated lactic acid as well as some mild acute kidney injury which prompted the emergency department staff to call the hospitalist service for admission.  Past Medical History:  Diagnosis Date  . Hep C w/o coma, chronic ([place])   . Substance abuse [facility])     Past Surgical History:  Procedure Laterality Date  . KNEE ARTHROCENTESIS Left   . TUBAL LIGATION N/A     No family history on file. Patient cannot provide reliable history Social History:  reports that she has been smoking.  she has never used smokeless tobacco. She reports that she drinks alcohol. She reports that she uses drugs. Drug: IV.  Allergies: No Known Allergies  Prior to Admission medications   Medication Sig Start Date End Date Taking? Authorizing Provider  ibuprofen (ADVIL,MOTRIN) 800 MG tablet Take 1 tablet (800 mg total) by mouth every 8 (eight) hours as needed for moderate pain. 06/04/15   Sable Feil, PA-C  ketorolac (TORADOL) 10 MG tablet Take 1 tablet (10 mg total) by mouth every 6 (six) hours as needed. For pain Patient not taking: Reported on 01/06/2015 06/06/14   Billy Fischer, MD  naloxone [facility]) 0.4 MG/ML injection Inject 1 mL (0.4 mg total) into the skin as needed. 05/30/15   Clapacs, Madie Reno, MD  naproxen (NAPROSYN) 500 MG tablet Take 1 tablet (500 mg total) by mouth 2 (two) times daily. Patient not taking: Reported  on 01/06/2015 05/30/14   Etta Quill, NP  oxyCODONE (ROXICODONE) 5 MG immediate release tablet Take 1 tablet (5 mg total) by mouth every 4 (four) hours as needed for severe pain. Patient not taking: Reported on 01/06/2015 05/30/14   Etta Quill, NP  penicillin v potassium (VEETID) 500 MG tablet Take 1 tablet (500 mg total) by mouth 3 (three) times daily. Patient not taking: Reported on 01/06/2015 05/30/14   Etta Quill, NP  QUEtiapine (SEROQUEL) 50 MG tablet Take 50 mg by mouth 2 (two) times daily.     [provider]  sulfamethoxazole-trimethoprim (BACTRIM DS,SEPTRA DS) 800-160 MG per tablet Take 1 tablet by mouth 2 (two) times daily. 06/04/15   Sable Feil, PA-C  traMADol (ULTRAM) 50 MG tablet Take 1 tablet (50 mg total) by mouth every 6 (six) hours as needed for moderate pain. 06/04/15   Sable Feil, PA-C     Results for orders placed or performed during the hospital encounter of 11/23/17 (from the past 48 hour(s))  Basic metabolic panel     Status: Abnormal   Collection Time: 11/23/17  5:53 AM  Result Value Ref Range   Sodium 142 135 - 145 mmol/L   Potassium 3.5 3.5 - 5.1 mmol/L   Chloride 104 101 - 111 mmol/L   CO2 21 (L) 22 - 32 mmol/L   Glucose, Bld 100 (H) 65 - 99 mg/dL   BUN 26 (H) 6 - 20 mg/dL   Creatinine, Ser  1.17 (H) 0.44 - 1.00 mg/dL   Calcium 9.9 8.9 - 10.3 mg/dL   GFR calc non Af Amer 59 (L) >60 mL/min   GFR calc Af Amer >60 >60 mL/min    Comment: (NOTE) The eGFR has been calculated using the CKD EPI equation. This calculation has not been validated in all clinical situations. eGFR's persistently <60 mL/min signify possible Chronic Kidney Disease.    Anion gap 17 (H) 5 - 15    Comment: Performed at [facility], [address] [place], [place] [address]  CBC WITH DIFFERENTIAL     Status: Abnormal   Collection Time: 11/23/17  5:53 AM  Result Value Ref Range   WBC 20.3 (H) 3.6 - 11.0 K/uL   RBC 5.86 (H) 3.80 - 5.20 MIL/uL   Hemoglobin 14.7  12.0 - 16.0 g/dL   HCT 45.2 35.0 - 47.0 %   MCV 77.2 (L) 80.0 - 100.0 fL   MCH 25.1 (L) 26.0 - 34.0 pg   MCHC 32.5 32.0 - 36.0 g/dL   RDW 15.8 (H) 11.5 - 14.5 %   Platelets 359 150 - 440 K/uL   Neutrophils Relative % 83 %   Neutro Abs 16.9 (H) 1.4 - 6.5 K/uL   Lymphocytes Relative 10 %   Lymphs Abs 2.1 1.0 - 3.6 K/uL   Monocytes Relative 6 %   Monocytes Absolute 1.1 (H) 0.2 - 0.9 K/uL   Eosinophils Relative 0 %   Eosinophils Absolute 0.0 0 - 0.7 K/uL   Basophils Relative 1 %   Basophils Absolute 0.2 (H) 0 - 0.1 K/uL    Comment: Performed at [facility], [address], [place], [place] [address]  Ammonia     Status: Abnormal   Collection Time: 11/23/17  6:00 AM  Result Value Ref Range   Ammonia <9 (L) 9 - 35 umol/L    Comment: Performed at [facility], [address] [place], [place] [address]  Lactic acid, plasma     Status: Abnormal   Collection Time: 11/23/17  6:00 AM  Result Value Ref Range   Lactic Acid, Venous 2.7 (HH) 0.5 - 1.9 mmol/L    Comment: CRITICAL RESULT CALLED TO, READ BACK BY AND VERIFIED WITH NOEL WEBSTER AT 9147 ON 11/23/17 [place]. Performed at [facility], [address] [place],  [address]    No results found.  Review of Systems  Unable to perform ROS: Mental status change    Blood pressure (!) 128/99, pulse 62, temperature 98 F (36.7 C), temperature source Oral, resp. rate (!) 23, height _0  (1.753 m), weight 99.8 kg (220 lb), last menstrual period 11/15/2017, SpO2 99 %. Physical Exam  Vitals reviewed. Constitutional: She is oriented to person, place, and time. She appears well-developed and well-nourished. No distress.  HENT:  Head: Normocephalic and atraumatic.  Mouth/Throat: Oropharynx is clear and moist.  Eyes: Conjunctivae and EOM are normal. Pupils are equal, round, and reactive to light. No scleral icterus.  Neck: Normal range of motion. No tracheal deviation present. No thyromegaly present.   Cardiovascular: Normal rate, regular rhythm and normal heart sounds. Exam reveals no gallop and no friction rub.  No murmur heard. Respiratory: Effort normal and breath sounds normal.  GI: Soft. Bowel sounds are normal. She exhibits no distension. There is no tenderness.  Genitourinary:  Genitourinary Comments: Deferred  Musculoskeletal: Normal range of motion. She exhibits no edema.  Lymphadenopathy:    She has no cervical adenopathy.  Neurological: She is  alert and oriented to person, place, and time. No cranial nerve deficit. She exhibits normal muscle tone.  Skin: Skin is warm and dry. No rash noted. No erythema.  Psychiatric: She has a normal mood and affect. Her behavior is normal. Judgment and thought content normal.     Assessment/Plan This is a 37 year old female admitted for seizure activity. 1.  Seizure: The patient has a history of opiate abuse and/or alcohol abuse.  I have placed her on CIWA protocol.  Ativan as needed for agitation.  Consult neurology for further input. 2.  Acute kidney injury: Prerenal; the patient is clinically dehydrated.  Hydrate with intravenous fluid.  Avoid nephrotoxic agents. 3.  Opiate abuse: Monitor for signs or symptoms of active withdrawal. 4.  Obesity: BMI 32; encouraged healthy diet and exercise 5.  DVT prophylaxis: Lovenox 6.  GI prophylaxis: none The patient is a full code.  Time spent on admission orders and patient care approximately 45 minutes  Harrie Foreman, MD 11/23/2017, 7:02 AM

## 2017-11-24 ENCOUNTER — Inpatient Hospital Stay: Payer: Self-pay

## 2017-11-24 DIAGNOSIS — R569 Unspecified convulsions: Principal | ICD-10-CM

## 2017-11-24 LAB — HIV ANTIBODY (ROUTINE TESTING W REFLEX): HIV Screen 4th Generation wRfx: NONREACTIVE

## 2017-11-24 MED ORDER — CEPHALEXIN 500 MG PO CAPS: 500.0000 mg | ORAL_CAPSULE | Freq: Two times a day (BID) | ORAL | 0 refills | Status: AC

## 2017-11-24 MED ORDER — SODIUM CHLORIDE 0.9 % IV SOLN
1.0000 g | INTRAVENOUS | Status: DC
  Administered 2017-11-24: 10:00:00 1 g via INTRAVENOUS
  Filled 2017-11-24: qty 10

## 2017-11-24 MED ORDER — GADOBENATE DIMEGLUMINE 529 MG/ML IV SOLN
20.0000 mL | Freq: Once | INTRAVENOUS | Status: AC | PRN
  Administered 2017-11-24: 20 mL via INTRAVENOUS

## 2017-11-24 NOTE — Discharge Summary (Signed)
[facility] [place] at [facility]   PATIENT NAME: Jasmine [name]    MR#:  [id number]  DATE OF BIRTH:  1981-07-31  DATE OF ADMISSION:  11/23/2017 ADMITTING PHYSICIAN: Arnaldo Natal, MD  DATE OF DISCHARGE: 11/24/2017  PRIMARY CARE PHYSICIAN: Patient, No Pcp Per    ADMISSION DIAGNOSIS:  Seizure (HCC) [R56.9]  DISCHARGE DIAGNOSIS:  Active Problems:   Seizures (HCC)   SECONDARY DIAGNOSIS:   Past Medical History:  Diagnosis Date  . Hep C w/o coma, chronic (HCC)   . Substance abuse (HCC)     HOSPITAL COURSE:   1. Altered mental status    Seizure: The patient has a history of opiate abuse and/or alcohol abuse.  for last 5 days in prison, so this could be due to withdrawal.  on CIWA protocol. Ativan as needed for agitation. Consult neurology for further input.  Here today was reported to be positive for UTI, started on antibiotic.    Neurology saw the patient, EEG is negative and MRI of brain is negative. Suggesting seizures due to withdrawal from her drug use. No medications needed .   2. Acute kidney injury: Prerenal; the patient is clinically dehydrated. Hydrate with intravenous fluid. Avoid nephrotoxic agents. 3. Opiate abuse: Monitor for signs or symptoms of active withdrawal.   Much alert and oriented today. 4. Obesity: BMI 32; encouraged healthy diet and exercise 5. DVT prophylaxis: Lovenox 6. GI prophylaxis:none  Discharge.  DISCHARGE CONDITIONS:   Stable.  CONSULTS OBTAINED:  Treatment Team:  Thana Farr, MD  DRUG ALLERGIES:  No Known Allergies  DISCHARGE MEDICATIONS:   Allergies as of 11/24/2017   No Known Allergies     Medication List    STOP taking these medications   ketorolac 10 MG tablet Commonly known as:  TORADOL   naproxen 500 MG tablet Commonly known as:  NAPROSYN   oxyCODONE 5 MG immediate release tablet Commonly known as:  ROXICODONE   penicillin v potassium 500 MG tablet Commonly known  as:  VEETID   sulfamethoxazole-trimethoprim 800-160 MG tablet Commonly known as:  BACTRIM DS,SEPTRA DS   traMADol 50 MG tablet Commonly known as:  ULTRAM     TAKE these medications   cephALEXin 500 MG capsule Commonly known as:  KEFLEX Take 1 capsule (500 mg total) by mouth 2 (two) times daily for 4 days.   ibuprofen 800 MG tablet Commonly known as:  ADVIL,MOTRIN Take 1 tablet (800 mg total) by mouth every 8 (eight) hours as needed for moderate pain.   naloxone 0.4 MG/ML injection Commonly known as:  NARCAN Inject 1 mL (0.4 mg total) into the skin as needed.   QUEtiapine 50 MG tablet Commonly known as:  SEROQUEL Take 50 mg by mouth 2 (two) times daily.        DISCHARGE INSTRUCTIONS:    Follow with primary care physician in one to 2 weeks.  If you experience worsening of your admission symptoms, develop shortness of breath, life threatening emergency, suicidal or homicidal thoughts you must seek medical attention immediately by calling 911 or calling your MD immediately  if symptoms less severe.  You Must read complete instructions/literature along with all the possible adverse reactions/side effects for all the Medicines you take and that have been prescribed to you. Take any new Medicines after you have completely understood and accept all the possible adverse reactions/side effects.   Please note  You were cared for by a hospitalist during your hospital stay. If you have any  questions about your discharge medications or the care you received while you were in the hospital after you are discharged, you can call the unit and asked to speak with the hospitalist on call if the hospitalist that took care of you is not available. Once you are discharged, your primary care physician will handle any further medical issues. Please note that NO REFILLS for any discharge medications will be authorized once you are discharged, as it is imperative that you return to your primary care  physician (or establish a relationship with a primary care physician if you do not have one) for your aftercare needs so that they can reassess your need for medications and monitor your lab values.    Today   CHIEF COMPLAINT:   Chief Complaint  Patient presents with  . Seizures    HISTORY OF PRESENT ILLNESS:  Jasmine [name]  is a 37 y.o. female with a known history of substance abuse as well as hep C presents to the emergency department from jail after multiple seizures today.  It is unclear if the patient has seizure disorder or if her seizures have been precipitated by withdrawal or illicit substance.  The patient cannot provide history aside from reporting that she has awaken multiple times today screaming.  This cannot be verified.  The rest of the history the patient provides is incoherent.  Laboratory evaluation in the emergency department showed elevated lactic acid as well as some mild acute kidney injury which prompted the emergency department staff to call the hospitalist service for admission.   VITAL SIGNS:  Blood pressure (!) 144/81, pulse 64, temperature (!) 97.5 F (36.4 C), temperature source Oral, resp. rate 18, height 5\' 9"  (1.753 m), weight 104.7 kg (230 lb 12.8 oz), last menstrual period 11/15/2017, SpO2 95 %.  I/O:    Intake/Output Summary (Last 24 hours) at 11/24/2017 1301 Last data filed at 11/24/2017 1200 Gross per 24 hour  Intake 3005 ml  Output 800 ml  Net 2205 ml    PHYSICAL EXAMINATION:  GENERAL:  37 y.o.-year-old patient lying in the bed with no acute distress.  EYES: Pupils equal, round, reactive to light and accommodation. No scleral icterus. Extraocular muscles intact.  HEENT: Head atraumatic, normocephalic. Oropharynx and nasopharynx clear.  NECK:  Supple, no jugular venous distention. No thyroid enlargement, no tenderness.  LUNGS: Normal breath sounds bilaterally, no wheezing, rales,rhonchi or crepitation. No use of accessory muscles of  respiration.  CARDIOVASCULAR: S1, S2 normal. No murmurs, rubs, or gallops.  ABDOMEN: Soft, non-tender, non-distended. Bowel sounds present. No organomegaly or mass.  EXTREMITIES: No pedal edema, cyanosis, or clubbing.  NEUROLOGIC: Cranial nerves II through XII are intact. Muscle strength 5/5 in all extremities. Sensation intact. Gait not checked.  PSYCHIATRIC: The patient is alert and oriented x 3.  SKIN: No obvious rash, lesion, or ulcer.   DATA REVIEW:   CBC Recent Labs  Lab 11/23/17 0553  WBC 20.3*  HGB 14.7  HCT 45.2  PLT 359    Chemistries  Recent Labs  Lab 11/23/17 0553  NA 142  K 3.5  CL 104  CO2 21*  GLUCOSE 100*  BUN 26*  CREATININE 1.17*  CALCIUM 9.9    Cardiac Enzymes No results for input(s): TROPONINI in the last 168 hours.  Microbiology Results  Results for orders placed or performed during the hospital encounter of 11/23/17  MRSA PCR Screening     Status: None   Collection Time: 11/23/17 10:45 AM  Result Value Ref Range  Status   MRSA by PCR NEGATIVE NEGATIVE Final    Comment:        The GeneXpert MRSA Assay (FDA approved for NASAL specimens only), is one component of a comprehensive MRSA colonization surveillance program. It is not intended to diagnose MRSA infection nor to guide or monitor treatment for MRSA infections. Performed at [facility], [address] [place], [place] [address]     RADIOLOGY:  Ct Head Wo Contrast  Result Date: 11/23/2017 CLINICAL DATA:  Witnessed seizures. Dysarthria. Polysubstance abuse. EXAM: CT HEAD WITHOUT CONTRAST TECHNIQUE: Contiguous axial images were obtained from the base of the skull through the vertex without intravenous contrast. COMPARISON:  Report from MRI brain from 11/29/1998 FINDINGS: Brain: Dilated Virchow-Robin space along the inferior margin of the right lentiform nucleus, as reported on prior brain MRI dated 11/29/1998. Otherwise, The brainstem, cerebellum, cerebral peduncles,  thalami, basal ganglia, basilar cisterns, and ventricular system appear within normal limits. No intracranial hemorrhage, mass lesion, or acute CVA. Vascular: Unremarkable Skull: Unremarkable Sinuses/Orbits: Unremarkable Other: No supplemental non-categorized findings. IMPRESSION: 1. No significant intracranial abnormality is identified. 2. Incidental chronic dilated perivascular space along the inferior margin of the right lentiform nucleus. Electronically Signed   By: Gaylyn RongWalter  Liebkemann M.D.   On: 11/23/2017 08:03   Mr Laqueta JeanBrain W UEWo Contrast  Result Date: 11/24/2017 CLINICAL DATA:  Altered level of consciousness. Confusion. Witnessed seizure. EXAM: MRI HEAD WITHOUT AND WITH CONTRAST TECHNIQUE: Multiplanar, multiecho pulse sequences of the brain and surrounding structures were obtained without and with intravenous contrast. CONTRAST:  20mL MULTIHANCE GADOBENATE DIMEGLUMINE 529 MG/ML IV SOLN COMPARISON:  CT abdomen and pelvis 11/23/2017. FINDINGS: Brain: The study is mildly degraded by patient motion. No acute or chronic intracranial abnormalities are present. No significant white matter disease is present. Basal ganglia are intact. The craniocervical junction is normal. Flow is present in the internal carotid arteries flow is present in the major intracranial arteries. Vascular: The flow is present in the major intracranial arteries. Skull and upper cervical spine: The skull base is within normal limits. Sinuses/Orbits: The paranasal sinuses and mastoid air cells are clear. Globes and orbits are within normal limits bilaterally. IMPRESSION: 1. Normal MRI of the brain for age. Electronically Signed   By: Marin Robertshristopher  Mattern M.D.   On: 11/24/2017 11:25    EKG:   Orders placed or performed during the hospital encounter of 11/23/17  . EKG 12-Lead  . EKG 12-Lead      Management plans discussed with the patient, family and they are in agreement.  CODE STATUS:     Code Status Orders  (From admission,  onward)        Start     Ordered   11/23/17 0839  Full code  Continuous     11/23/17 0838    Code Status History    Date Active Date Inactive Code Status Order ID Comments User Context   01/07/2015 00:39 01/08/2015 16:42 Full Code [id number]  Mirian MoGentry, Matthew, MD ED      TOTAL TIME TAKING CARE OF THIS PATIENT: 35 minutes.    Altamese DillingVaibhavkumar Jeanny Rymer M.D on 11/24/2017 at 1:01 PM  Between 7am to 6pm - Pager - [phone]  After 6pm go to [email or web address] - password [email or web address]  [facility]  Office  [phone]  CC: Primary care physician; Patient, No Pcp Per   Note: This dictation was prepared with Dragon dictation along with smaller phrase technology. Any transcriptional errors that result from this process are unintentional.

## 2017-11-24 NOTE — Progress Notes (Signed)
Discharge completed by Paulita CradleKrista Young, RN. Bo McclintockBrewer,Takeshi Teasdale S, RN

## 2017-11-24 NOTE — Progress Notes (Addendum)
Subjective: No further seizures noted.  Patient more alert today.  Reports that she does not drink alcohol at all.  Reports that she cannot remember when she last used illicit drugs.  Objective: Current vital signs: BP (!) 144/81 (BP Location: Right Arm)   Pulse 64   Temp (!) 97.5 F (36.4 C) (Oral)   Resp 18   Ht 5\' 9"  (1.753 m)   Wt 104.7 kg (230 lb 12.8 oz)   LMP 11/15/2017 (Exact Date)   SpO2 95% Comment: continuous pulse ox not reading well, used handheld  BMI 34.08 kg/m  Vital signs in last 24 hours: Temp:  [97.5 F (36.4 C)-97.8 F (36.6 C)] 97.5 F (36.4 C) (02/12 0345) Pulse Rate:  [48-72] 64 (02/12 0345) Resp:  [18] 18 (02/12 0345) BP: (121-144)/(66-88) 144/81 (02/12 0345) SpO2:  [95 %-98 %] 95 % (02/12 0554) Weight:  [104.7 kg (230 lb 12.8 oz)] 104.7 kg (230 lb 12.8 oz) (02/12 0500)  Intake/Output from previous day: 02/11 0701 - 02/12 0700 In: 3105 [I.V.:3005; IV Piggyback:100] Out: 400 [Urine:400] Intake/Output this shift: No intake/output data recorded. Nutritional status: Seizure precautions Diet regular Room service appropriate? Yes; Fluid consistency: Thin  Neurologic Exam: Mental Status: Alert, oriented, thought content appropriate.  Speech fluent without evidence of aphasia.  Able to follow 3 step commands without difficulty. Cranial Nerves: II: Discs flat bilaterally; Visual fields grossly normal, pupils equal, round, reactive to light and accommodation III,IV, VI: ptosis not present, extra-ocular motions intact bilaterally V,VII: smile symmetric, facial light touch sensation normal bilaterally VIII: hearing normal bilaterally IX,X: gag reflex present XI: bilateral shoulder shrug XII: midline tongue extension Motor: Right : Upper extremity   5/5    Left:     Upper extremity   5/5  Lower extremity   5/5     Lower extremity   5/5 Tone and bulk:normal tone throughout; no atrophy noted Sensory: Pinprick and light touch intact throughout,  bilaterally   Lab Results: Basic Metabolic Panel: Recent Labs  Lab 11/23/17 0553  NA 142  K 3.5  CL 104  CO2 21*  GLUCOSE 100*  BUN 26*  CREATININE 1.17*  CALCIUM 9.9    Liver Function Tests: No results for input(s): AST, ALT, ALKPHOS, BILITOT, PROT, ALBUMIN in the last 168 hours. No results for input(s): LIPASE, AMYLASE in the last 168 hours. Recent Labs  Lab 11/23/17 0600  AMMONIA <9*    CBC: Recent Labs  Lab 11/23/17 0553  WBC 20.3*  NEUTROABS 16.9*  HGB 14.7  HCT 45.2  MCV 77.2*  PLT 359    Cardiac Enzymes: No results for input(s): CKTOTAL, CKMB, CKMBINDEX, TROPONINI in the last 168 hours.  Lipid Panel: No results for input(s): CHOL, TRIG, HDL, CHOLHDL, VLDL, LDLCALC in the last 168 hours.  CBG: No results for input(s): GLUCAP in the last 168 hours.  Microbiology: Results for orders placed or performed during the hospital encounter of 11/23/17  MRSA PCR Screening     Status: None   Collection Time: 11/23/17 10:45 AM  Result Value Ref Range Status   MRSA by PCR NEGATIVE NEGATIVE Final    Comment:        The GeneXpert MRSA Assay (FDA approved for NASAL specimens only), is one component of a comprehensive MRSA colonization surveillance program. It is not intended to diagnose MRSA infection nor to guide or monitor treatment for MRSA infections. Performed at [facility], [address] [place], [place] [address]     Coagulation Studies: No  results for input(s): LABPROT, INR in the last 72 hours.  Imaging: Ct Head Wo Contrast  Result Date: 11/23/2017 CLINICAL DATA:  Witnessed seizures. Dysarthria. Polysubstance abuse. EXAM: CT HEAD WITHOUT CONTRAST TECHNIQUE: Contiguous axial images were obtained from the base of the skull through the vertex without intravenous contrast. COMPARISON:  Report from MRI brain from 11/29/1998 FINDINGS: Brain: Dilated Virchow-Robin space along the inferior margin of the right lentiform nucleus, as reported  on prior brain MRI dated 11/29/1998. Otherwise, The brainstem, cerebellum, cerebral peduncles, thalami, basal ganglia, basilar cisterns, and ventricular system appear within normal limits. No intracranial hemorrhage, mass lesion, or acute CVA. Vascular: Unremarkable Skull: Unremarkable Sinuses/Orbits: Unremarkable Other: No supplemental non-categorized findings. IMPRESSION: 1. No significant intracranial abnormality is identified. 2. Incidental chronic dilated perivascular space along the inferior margin of the right lentiform nucleus. Electronically Signed   By: Gaylyn Rong M.D.   On: 11/23/2017 08:03    Medications:  I have reviewed the patient's current medications. Scheduled: . enoxaparin (LOVENOX) injection  40 mg Subcutaneous Q24H  . folic acid  1 mg Oral Daily  . Influenza vac split quadrivalent PF  0.5 mL Intramuscular Tomorrow-1000  . LORazepam  0-4 mg Intravenous Q6H   Followed by  . [START ON 11/25/2017] LORazepam  0-4 mg Intravenous Q12H  . multivitamin with minerals  1 tablet Oral Daily  . thiamine  100 mg Oral Daily    Assessment/Plan: Patient much improved this morning.  EEG unremarkable. UDS positive for opiates, cocaine and THC.  Patient to obtain MRI of the brain today.  If MRI of the brain is unremarkable suspect that seizures are related to alcohol and drug abuse with abrupt discontinuation due to incarceration.  Would not start anticonvulsant therapy at this time.  Patient would also be stable for discharge.   LOS: 1 day   Thana Farr, MD Neurology [phone] 11/24/2017  11:00 AM

## 2017-11-24 NOTE — Discharge Instructions (Addendum)
Seizure, Adult °When you have a seizure: °· Parts of your body may move. °· How aware or awake (conscious) you are may change. °· You may shake (convulse). ° °Some people have symptoms right before a seizure happens. These symptoms may include: °· Fear. °· Worry (anxiety). °· Feeling like you are going to throw up (nausea). °· Feeling like the room is spinning (vertigo). °· Feeling like you saw or heard something before (deja vu). °· Odd tastes or smells. °· Changes in vision, such as seeing flashing lights or spots. ° °Seizures usually last from 30 seconds to 2 minutes. Usually, they are not harmful unless they last a long time. °Follow these instructions at home: °Medicines °· Take over-the-counter and prescription medicines only as told by your doctor. °· Avoid anything that may keep your medicine from working, such as alcohol. °Activity °· Do not do any activities that would be dangerous if you had another seizure, like driving or swimming. Wait until your doctor approves. °· If you live in the [place] ask your local DMV (department of motor vehicles) when you can drive. °· Rest. °Teaching others °· Teach friends and family what to do when you have a seizure. They should: °? Lay you on the ground. °? Protect your head and body. °? Loosen any tight clothing around your neck. °? Turn you on your side. °? Stay with you until you are better. °? Not hold you down. °? Not put anything in your mouth. °? Know whether or not you need emergency care. °General instructions °· Contact your doctor each time you have a seizure. °· Avoid anything that gives you seizures. °· Keep a seizure diary. Write down: °? What you think caused each seizure. °? What you remember about each seizure. °· Keep all follow-up visits as told by your doctor. This is important. °Contact a doctor if: °· You have another seizure. °· You have seizures more often. °· There is any change in what happens during your seizures. °· You continue to have  seizures with treatment. °· You have symptoms of being sick or having an infection. °Get help right away if: °· You have a seizure: °? That lasts longer than 5 minutes. °? That is different than seizures you had before. °? That makes it harder to breathe. °? After you hurt your head. °· After a seizure, you cannot speak or use a part of your body. °· After a seizure, you are confused or have a bad headache. °· You have two or more seizures in a row. °· You are having seizures more often. °· You do not wake up right after a seizure. °· You get hurt during a seizure. °In an emergency: °· These symptoms may be an emergency. Do not wait to see if the symptoms will go away. Get medical help right away. Call your local emergency services (911 in the [place] Do not drive yourself to the hospital. °This information is not intended to replace advice given to you by your health care provider. Make sure you discuss any questions you have with your health care provider. °Document Released: 03/17/2008 Document Revised: 06/11/2016 Document Reviewed: 06/11/2016 °Elsevier Interactive Patient Education © 2017 Elsevier Inc. ° °

## 2017-11-26 LAB — URINE CULTURE

## 2018-06-30 ENCOUNTER — Other Ambulatory Visit: Payer: Self-pay | Admitting: Student

## 2018-06-30 DIAGNOSIS — B182 Chronic viral hepatitis C: Secondary | ICD-10-CM

## 2018-07-07 ENCOUNTER — Ambulatory Visit: Payer: BLUE CROSS/BLUE SHIELD

## 2018-07-28 ENCOUNTER — Ambulatory Visit: Payer: BLUE CROSS/BLUE SHIELD | Admitting: Psychiatry

## 2018-07-28 ENCOUNTER — Other Ambulatory Visit: Payer: Self-pay

## 2018-07-28 ENCOUNTER — Encounter: Payer: Self-pay | Admitting: Psychiatry

## 2018-07-28 VITALS — BP 114/79 | HR 63 | Temp 98.8°F | Ht 69.5 in | Wt 229.6 lb

## 2018-07-28 DIAGNOSIS — F3281 Premenstrual dysphoric disorder: Secondary | ICD-10-CM

## 2018-07-28 DIAGNOSIS — F17200 Nicotine dependence, unspecified, uncomplicated: Secondary | ICD-10-CM | POA: Diagnosis not present

## 2018-07-28 DIAGNOSIS — F1121 Opioid dependence, in remission: Secondary | ICD-10-CM | POA: Diagnosis not present

## 2018-07-28 DIAGNOSIS — B1920 Unspecified viral hepatitis C without hepatic coma: Secondary | ICD-10-CM | POA: Insufficient documentation

## 2018-07-28 MED ORDER — PAROXETINE HCL 20 MG PO TABS: 30.0000 mg | ORAL_TABLET | Freq: Every day | ORAL | 1 refills | Status: DC

## 2018-07-28 MED ORDER — HYDROXYZINE PAMOATE 25 MG PO CAPS: 25.0000 mg | ORAL_CAPSULE | Freq: Every evening | ORAL | 1 refills | Status: DC | PRN

## 2018-07-28 NOTE — Patient Instructions (Signed)
Hydroxyzine capsules or tablets What is this medicine? HYDROXYZINE (hye DROX i zeen) is an antihistamine. This medicine is used to treat allergy symptoms. It is also used to treat anxiety and tension. This medicine can be used with other medicines to induce sleep before surgery. This medicine may be used for other purposes; ask your health care provider or pharmacist if you have questions. COMMON BRAND NAME(S): ANX, Atarax, Rezine, Vistaril What should I tell my health care provider before I take this medicine? They need to know if you have any of these conditions: -any chronic illness -difficulty passing urine -glaucoma -heart disease -kidney disease -liver disease -lung disease -an unusual or allergic reaction to hydroxyzine, cetirizine, other medicines, foods, dyes, or preservatives -pregnant or trying to get pregnant -breast-feeding How should I use this medicine? Take this medicine by mouth with a full glass of water. Follow the directions on the prescription label. You may take this medicine with food or on an empty stomach. Take your medicine at regular intervals. Do not take your medicine more often than directed. Talk to your pediatrician regarding the use of this medicine in children. Special care may be needed. While this drug may be prescribed for children as young as 6 years of age for selected conditions, precautions do apply. Patients over 65 years old may have a stronger reaction and need a smaller dose. Overdosage: If you think you have taken too much of this medicine contact a poison control center or emergency room at once. NOTE: This medicine is only for you. Do not share this medicine with others. What if I miss a dose? If you miss a dose, take it as soon as you can. If it is almost time for your next dose, take only that dose. Do not take double or extra doses. What may interact with this medicine? -alcohol -barbiturate medicines for sleep or seizures -medicines for  colds, allergies -medicines for depression, anxiety, or emotional disturbances -medicines for pain -medicines for sleep -muscle relaxants This list may not describe all possible interactions. Give your health care provider a list of all the medicines, herbs, non-prescription drugs, or dietary supplements you use. Also tell them if you smoke, drink alcohol, or use illegal drugs. Some items may interact with your medicine. What should I watch for while using this medicine? Tell your doctor or health care professional if your symptoms do not improve. You may get drowsy or dizzy. Do not drive, use machinery, or do anything that needs mental alertness until you know how this medicine affects you. Do not stand or sit up quickly, especially if you are an older patient. This reduces the risk of dizzy or fainting spells. Alcohol may interfere with the effect of this medicine. Avoid alcoholic drinks. Your mouth may get dry. Chewing sugarless gum or sucking hard candy, and drinking plenty of water may help. Contact your doctor if the problem does not go away or is severe. This medicine may cause dry eyes and blurred vision. If you wear contact lenses you may feel some discomfort. Lubricating drops may help. See your eye doctor if the problem does not go away or is severe. If you are receiving skin tests for allergies, tell your doctor you are using this medicine. What side effects may I notice from receiving this medicine? Side effects that you should report to your doctor or health care professional as soon as possible: -fast or irregular heartbeat -difficulty passing urine -seizures -slurred speech or confusion -tremor Side effects that   usually do not require medical attention (report to your doctor or health care professional if they continue or are bothersome): -constipation -drowsiness -fatigue -headache -stomach upset This list may not describe all possible side effects. Call your doctor for  medical advice about side effects. You may report side effects to FDA at 1-800-FDA-1088. Where should I keep my medicine? Keep out of the reach of children. Store at room temperature between 15 and 30 degrees C (59 and 86 degrees F). Keep container tightly closed. Throw away any unused medicine after the expiration date. NOTE: This sheet is a summary. It may not cover all possible information. If you have questions about this medicine, talk to your doctor, pharmacist, or health care provider.  2018 Elsevier/Gold Standard (2008-02-11 14:50:59)  
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
## 2018-08-03 ENCOUNTER — Ambulatory Visit (INDEPENDENT_AMBULATORY_CARE_PROVIDER_SITE_OTHER): Payer: BLUE CROSS/BLUE SHIELD | Admitting: Licensed Clinical Social Worker

## 2018-08-03 ENCOUNTER — Encounter: Payer: Self-pay | Admitting: Licensed Clinical Social Worker

## 2018-08-03 DIAGNOSIS — F3281 Premenstrual dysphoric disorder: Secondary | ICD-10-CM | POA: Diagnosis not present
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
## 2018-08-17 ENCOUNTER — Encounter: Payer: Self-pay | Admitting: Licensed Clinical Social Worker

## 2018-08-17 ENCOUNTER — Ambulatory Visit (INDEPENDENT_AMBULATORY_CARE_PROVIDER_SITE_OTHER): Payer: BLUE CROSS/BLUE SHIELD | Admitting: Licensed Clinical Social Worker

## 2018-08-17 DIAGNOSIS — F3281 Premenstrual dysphoric disorder: Secondary | ICD-10-CM | POA: Diagnosis not present
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
## 2018-08-19 ENCOUNTER — Encounter: Payer: Self-pay | Admitting: Emergency Medicine

## 2018-08-19 ENCOUNTER — Emergency Department: Payer: BLUE CROSS/BLUE SHIELD

## 2018-08-19 ENCOUNTER — Other Ambulatory Visit: Payer: Self-pay | Admitting: Psychiatry

## 2018-08-19 ENCOUNTER — Other Ambulatory Visit: Payer: Self-pay

## 2018-08-19 ENCOUNTER — Emergency Department
Admission: EM | Admit: 2018-08-19 | Discharge: 2018-08-19 | Disposition: A | Discharge status: Home / Self Care | Payer: BLUE CROSS/BLUE SHIELD | Attending: Emergency Medicine | Admitting: Emergency Medicine

## 2018-08-19 DIAGNOSIS — M7918 Myalgia, other site: Secondary | ICD-10-CM | POA: Diagnosis present

## 2018-08-19 DIAGNOSIS — F17210 Nicotine dependence, cigarettes, uncomplicated: Secondary | ICD-10-CM | POA: Diagnosis not present

## 2018-08-19 DIAGNOSIS — Z79899 Other long term (current) drug therapy: Secondary | ICD-10-CM | POA: Diagnosis not present

## 2018-08-19 MED ORDER — PREDNISONE 10 MG PO TABS: ORAL_TABLET | ORAL | 0 refills | Status: DC

## 2018-08-19 MED ORDER — BACLOFEN 5 MG PO TABS: 1.0000 | ORAL_TABLET | Freq: Every evening | ORAL | 0 refills | Status: DC | PRN

## 2018-08-19 NOTE — Discharge Instructions (Addendum)
Follow-up with your primary care provider if any continued problems.  Begin taking prednisone as directed tapering down 1 pill each day.  Also baclofen 5 mg 1 daily at bedtime.  You may use ice or heat to your neck as needed for discomfort.  You may also use physical therapy and can discuss this with your primary care provider.
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
## 2018-08-19 NOTE — ED Notes (Signed)
See triage note  Presents with pain to neck and it is moving into right shoulder  Denies recent injury  States she has been taking OTC meds with no relief

## 2018-08-19 NOTE — ED Triage Notes (Signed)
Pt arrived with complaints of left sided neck pain that started 2 weeks prior. No known injury. No fevers/night sweats. Pt reports the pain feels like burning sensation and is worse with movement.

## 2018-08-20 ENCOUNTER — Encounter: Payer: Self-pay | Admitting: Emergency Medicine

## 2018-08-25 ENCOUNTER — Ambulatory Visit: Payer: BLUE CROSS/BLUE SHIELD | Admitting: Psychiatry

## 2018-09-06 ENCOUNTER — Ambulatory Visit: Payer: BLUE CROSS/BLUE SHIELD | Admitting: Licensed Clinical Social Worker

## 2018-09-06 DIAGNOSIS — F3281 Premenstrual dysphoric disorder: Secondary | ICD-10-CM

## 2018-09-13 ENCOUNTER — Other Ambulatory Visit: Payer: Self-pay

## 2018-09-13 ENCOUNTER — Ambulatory Visit: Payer: BLUE CROSS/BLUE SHIELD | Admitting: Psychiatry

## 2018-09-13 ENCOUNTER — Encounter: Payer: Self-pay | Admitting: Psychiatry

## 2018-09-13 VITALS — BP 125/86 | HR 71 | Temp 97.5°F | Wt 239.2 lb

## 2018-09-13 DIAGNOSIS — F17200 Nicotine dependence, unspecified, uncomplicated: Secondary | ICD-10-CM

## 2018-09-13 DIAGNOSIS — F3281 Premenstrual dysphoric disorder: Secondary | ICD-10-CM | POA: Diagnosis not present

## 2018-09-13 DIAGNOSIS — F1121 Opioid dependence, in remission: Secondary | ICD-10-CM

## 2018-09-13 MED ORDER — PAROXETINE HCL 20 MG PO TABS: 20.0000 mg | ORAL_TABLET | Freq: Every day | ORAL | 1 refills | Status: DC

## 2018-09-13 NOTE — Progress Notes (Signed)
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  Patient today reports she is currently making progress with regards to her mood symptoms.  She however reports she is worried about the side effects of Paxil.  She is currently on Paxil 30 mg which she reports could be contributing to weight gain as well as sexual side effects.  She reports she struggles with a low libido at this time.  She reports this has been affecting her marriage.  Discussed changing her Paxil to another medication however she wants to wait.  We will reduce the dosage back to her previous dose of 20 mg.  Patient reports sleep is good.  She continues to stay away from opioids.  She is currently in psychotherapy with Joni Reining which is going well.  Patient denies any suicidality or perceptual disturbances.  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  She denies any other concerns today. Visit Diagnosis:    ICD-10-CM   1. PMDD (premenstrual dysphoric disorder) F32.81   2. Moderate opioid use disorder, in early remission (HCC) F11.21   3. Tobacco use disorder F17.200     Past Psychiatric History: I have reviewed past psychiatric history from my progress note on 07/28/2018.  Past trials of Prozac, Seroquel, Paxil  Past Medical History:  Past Medical History:  Diagnosis Date  . Hep C w/o coma, chronic (HCC)   . Substance abuse [facility])     Past Surgical History:  Procedure Laterality Date  . KNEE ARTHROCENTESIS Left   . PATELLECTOMY    . TUBAL LIGATION N/A   . TUBAL LIGATION      Family Psychiatric History: I have reviewed family psychiatric  history from my progress note on 07/28/2018.  Family History:  Family History  Problem Relation Age of Onset  . Depression Mother   . Depression Brother     Social History: Reviewed social history from my progress note on 07/28/2018 Social History   Socioeconomic History  . Marital status: Married    Spouse name: rodney  . Number of children: 2  . Years of education: Not on file  . Highest education level: Some college, no degree  Occupational History  . Not on file  Social Needs  . Financial resource strain: Not hard at all  . Food insecurity:    Worry: Never true    Inability: Never true  . Transportation needs:    Medical: No    Non-medical: No  Tobacco Use  . Smoking status: Current Every Day Smoker    Packs/day: 1.00    Types: Cigarettes  . Smokeless tobacco: Never Used  Substance and Sexual Activity  . Alcohol use: Not Currently    Frequency: Never    Comment: occ  . Drug use: Not Currently    Types: Heroin, IV    Comment: heroin  . Sexual activity: Yes  Lifestyle  . Physical activity:    Days per week: 0 days    Minutes per session: 0 min  . Stress: Very much  Relationships  . Social connections:    Talks on phone: Not  on file    Gets together: Not on file    Attends religious service: More than 4 times per year    Active member of club or organization: No    Attends meetings of clubs or organizations: Never    Relationship status: Married  Other Topics Concern  . Not on file  Social History Narrative   ** Merged History Encounter **        Allergies:  Allergies  Allergen Reactions  . Hydrocodone-Acetaminophen     Metabolic Disorder Labs: No results found for: HGBA1C, MPG No results found for: PROLACTIN No results found for: CHOL, TRIG, HDL, CHOLHDL, VLDL, LDLCALC Lab Results  Component Value Date   TSH 1.151 11/23/2017    Therapeutic Level Labs: No results found for: LITHIUM No results found for: VALPROATE No components found for:   CBMZ  Current Medications: Current Outpatient Medications  Medication Sig Dispense Refill  . Baclofen 5 MG TABS Take 1 tablet by mouth at bedtime as needed. 7 tablet 0  . hydrOXYzine (VISTARIL) 25 MG capsule TAKE 1-2 CAPSULES (25-50 MG TOTAL) BY MOUTH AT BEDTIME AS NEEDED. FOR SLEEP 180 capsule 1  . ibuprofen (ADVIL,MOTRIN) 800 MG tablet Take 1 tablet (800 mg total) by mouth every 8 (eight) hours as needed for moderate pain. (Patient not taking: Reported on 11/24/2017) 15 tablet 0  . naloxone (NARCAN) 0.4 MG/ML injection Inject 1 mL (0.4 mg total) into the skin as needed. (Patient not taking: Reported on 11/24/2017) 1 mL 1  . PARoxetine (PAXIL) 20 MG tablet Take 1 tablet (20 mg total) by mouth daily. 30 tablet 1  . predniSONE (DELTASONE) 10 MG tablet Take 6 tablets  today, on day 2 take 5 tablets, day 3 take 4 tablets, day 4 take 3 tablets, day 5 take  2 tablets and 1 tablet the last day 21 tablet 0  . traZODone (DESYREL) 50 MG tablet TAKE ONE AT BEDTIME NIGHTLY AS NEEDED FOR SLEEP     No current facility-administered medications for this visit.      Musculoskeletal: Strength & Muscle Tone: within normal limits Gait & Station: normal Patient leans: N/A  Psychiatric Specialty Exam: Review of Systems  Psychiatric/Behavioral: The patient has insomnia.   All other systems reviewed and are negative.   Blood pressure 125/86, pulse 71, temperature (!) 97.5 F (36.4 C), temperature source Oral, weight 239 lb 3.2 oz (108.5 kg).Body mass index is 34.32 kg/m.  General Appearance: Casual  Eye Contact:  Fair  Speech:  Clear and Coherent  Volume:  Normal  Mood:  Euthymic  Affect:  Congruent  Thought Process:  Goal Directed and Descriptions of Associations: Intact  Orientation:  Full (Time, Place, and Person)  Thought Content: Logical   Suicidal Thoughts:  No  Homicidal Thoughts:  No  Memory:  Immediate;   Fair Recent;   Fair Remote;   Fair  Judgement:  Fair  Insight:  Fair  Psychomotor  Activity:  Normal  Concentration:  Concentration: Fair and Attention Span: Fair  Recall:  [employer] of Knowledge: Fair  Language: Fair  Akathisia:  No  Handed:  Right  AIMS (if indicated): Denies tremors, rigidity.  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  Patient is biologically predisposed given her substance abuse problems as well as her family history of mental health problems as well as history of trauma.  Patient also has psychosocial stressors of legal problems, being separated from her 38 year old son and so on.  Patient is currently making progress on the current medication however continues to have some side effects.  She will continue psychotherapy sessions which are going well.  Plan PMDD Reduce Paxil to 20 mg p.o. daily Pursue psychotherapy-she will continue therapy sessions with Ms. Nolon RodNicole Peacock.  For insomnia Trazodone 50-100 mg p.o. nightly Hydroxyzine 25-50mg  p.o. 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  She has been sober since the past 9 months.  For tobacco use disorder Provided smoking cessation counseling.  Follow-up in clinic in 1 week or sooner if needed.  Labs - TSH - pending.  More than 50 % of the time was spent for psychoeducation and supportive psychotherapy and care coordination.  This note was generated in part or whole with voice recognition software. Voice recognition is usually quite accurate but there are transcription errors that can and very often do occur. I apologize for any typographical errors that were not  detected and corrected.         Jomarie LongsSaramma Zaira Iacovelli, MD 09/13/2018, 4:37 PM

## 2018-09-21 ENCOUNTER — Ambulatory Visit: Payer: BLUE CROSS/BLUE SHIELD | Admitting: Psychiatry

## 2018-09-22 ENCOUNTER — Ambulatory Visit: Payer: Self-pay | Admitting: Licensed Clinical Social Worker

## 2018-09-30 ENCOUNTER — Ambulatory Visit: Payer: BLUE CROSS/BLUE SHIELD | Admitting: Psychiatry

## 2018-10-11 ENCOUNTER — Ambulatory Visit: Payer: BLUE CROSS/BLUE SHIELD | Admitting: Licensed Clinical Social Worker

## 2018-10-14 ENCOUNTER — Ambulatory Visit: Payer: BLUE CROSS/BLUE SHIELD | Admitting: Psychiatry

## 2018-10-19 ENCOUNTER — Other Ambulatory Visit: Payer: Self-pay | Admitting: Psychiatry

## 2018-10-19 ENCOUNTER — Other Ambulatory Visit: Payer: Self-pay

## 2018-10-19 ENCOUNTER — Encounter: Payer: Self-pay | Admitting: Psychiatry

## 2018-10-19 ENCOUNTER — Ambulatory Visit: Payer: BLUE CROSS/BLUE SHIELD | Admitting: Psychiatry

## 2018-10-19 VITALS — BP 126/83 | HR 93 | Temp 97.9°F | Wt 239.4 lb

## 2018-10-19 DIAGNOSIS — F3281 Premenstrual dysphoric disorder: Secondary | ICD-10-CM | POA: Diagnosis not present

## 2018-10-19 DIAGNOSIS — F17200 Nicotine dependence, unspecified, uncomplicated: Secondary | ICD-10-CM

## 2018-10-19 DIAGNOSIS — F1121 Opioid dependence, in remission: Secondary | ICD-10-CM

## 2018-10-19 MED ORDER — VENLAFAXINE HCL ER 37.5 MG PO CP24: 37.5000 mg | ORAL_CAPSULE | Freq: Every day | ORAL | 1 refills | Status: DC

## 2018-10-19 NOTE — Progress Notes (Signed)
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  Patient today reports she continues to struggle with some mood lability.  She reports she had the worst period of her life last month.  She reports she was very irritable and angry around that time.  She reports she does not think the Paxil at this dosage is helpful.  Her Paxil dosage was reduced last visit due to side effects of weight gain and sexual side effects.  Discussed starting a trial of a new antidepressant like Effexor which she has never tried before.  She agrees with plan.  Patient reports sleep is improved.  She reports she has been spending more time with her son which is good.  She is working on getting his custody back and reports she is motivated to stay focused on her goal.  Her husband continues to be supportive.  Patient reports she continues to smoke cigarettes and is not ready to quit yet.  She however continues to stay away from opioids and on November 16, 2018 will be 12 months since her last use.  She is proud of herself.  Patient denies any suicidality, homicidality or perceptual disturbances.   Visit Diagnosis:    ICD-10-CM   1. PMDD (premenstrual dysphoric disorder) F32.81 venlafaxine XR (EFFEXOR-XR) 37.5 MG 24 hr capsule  2. Moderate opioid use disorder, in early remission (HCC) F11.21   3. Tobacco use disorder F17.200     Past Psychiatric History: Reviewed past psychiatric history from my progress note on 07/28/2018.  Past trials of Prozac, Seroquel, Paxil.  Past Medical History:  Past Medical History:  Diagnosis Date  . Hep C w/o coma, chronic (HCC)   .  Substance abuse [facility])     Past Surgical History:  Procedure Laterality Date  . KNEE ARTHROCENTESIS Left   . PATELLECTOMY    . TUBAL LIGATION N/A   . TUBAL LIGATION      Family Psychiatric History: Reviewed family psychiatric history from my progress note on 07/28/2018.  Family History:  Family History  Problem Relation Age of Onset  . Depression Mother   . Depression Brother     Social History: Reviewed social history from my progress note on 07/28/2018. Social History   Socioeconomic History  . Marital status: Married    Spouse name: rodney  . Number of children: 2  . Years of education: Not on file  . Highest education level: Some college, no degree  Occupational History  . Not on file  Social Needs  . Financial resource strain: Not hard at all  . Food insecurity:    Worry: Never true    Inability: Never true  . Transportation needs:    Medical: No    Non-medical: No  Tobacco Use  . Smoking status: Current Every Day Smoker    Packs/day: 1.00    Types: Cigarettes  . Smokeless tobacco: Never Used  Substance and Sexual Activity  . Alcohol use: Not Currently    Frequency: Never    Comment: occ  . Drug use: Not Currently    Types: Heroin, IV    Comment:  heroin  . Sexual activity: Yes  Lifestyle  . Physical activity:    Days per week: 0 days    Minutes per session: 0 min  . Stress: Very much  Relationships  . Social connections:    Talks on phone: Not on file    Gets together: Not on file    Attends religious service: More than 4 times per year    Active member of club or organization: No    Attends meetings of clubs or organizations: Never    Relationship status: Married  Other Topics Concern  . Not on file  Social History Narrative   ** Merged History Encounter **        Allergies:  Allergies  Allergen Reactions  . Hydrocodone-Acetaminophen     Metabolic Disorder Labs: No results found for: HGBA1C, MPG No results found for: PROLACTIN No  results found for: CHOL, TRIG, HDL, CHOLHDL, VLDL, LDLCALC Lab Results  Component Value Date   TSH 1.151 11/23/2017    Therapeutic Level Labs: No results found for: LITHIUM No results found for: VALPROATE No components found for:  CBMZ  Current Medications: Current Outpatient Medications  Medication Sig Dispense Refill  . Baclofen 5 MG TABS Take 1 tablet by mouth at bedtime as needed. 7 tablet 0  . hydrOXYzine (VISTARIL) 25 MG capsule TAKE 1-2 CAPSULES (25-50 MG TOTAL) BY MOUTH AT BEDTIME AS NEEDED. FOR SLEEP 180 capsule 1  . ibuprofen (ADVIL,MOTRIN) 800 MG tablet Take 1 tablet (800 mg total) by mouth every 8 (eight) hours as needed for moderate pain. 15 tablet 0  . naloxone (NARCAN) 0.4 MG/ML injection Inject 1 mL (0.4 mg total) into the skin as needed. 1 mL 1  . predniSONE (DELTASONE) 10 MG tablet Take 6 tablets  today, on day 2 take 5 tablets, day 3 take 4 tablets, day 4 take 3 tablets, day 5 take  2 tablets and 1 tablet the last day 21 tablet 0  . traZODone (DESYREL) 50 MG tablet TAKE ONE AT BEDTIME NIGHTLY AS NEEDED FOR SLEEP    . venlafaxine XR (EFFEXOR-XR) 37.5 MG 24 hr capsule Take 1 capsule (37.5 mg total) by mouth daily with breakfast. For mood symptoms 30 capsule 1   No current facility-administered medications for this visit.      Musculoskeletal: Strength & Muscle Tone: within normal limits Gait & Station: normal Patient leans: N/A  Psychiatric Specialty Exam: Review of Systems  Psychiatric/Behavioral: The patient is nervous/anxious.   All other systems reviewed and are negative.   Blood pressure 126/83, pulse 93, temperature 97.9 F (36.6 C), temperature source Oral, weight 239 lb 6.4 oz (108.6 kg).Body mass index is 34.35 kg/m.  General Appearance: Casual  Eye Contact:  Fair  Speech:  Clear and Coherent  Volume:  Normal  Mood:  Anxious  Affect:  Appropriate  Thought Process:  Goal Directed and Descriptions of Associations: Intact  Orientation:  Full (Time,  Place, and Person)  Thought Content: Logical   Suicidal Thoughts:  No  Homicidal Thoughts:  No  Memory:  Immediate;   Fair Recent;   Fair Remote;   Fair  Judgement:  Fair  Insight:  Fair  Psychomotor Activity:  Normal  Concentration:  Concentration: Fair and Attention Span: Fair  Recall:  Jasmine [name]  Fund of Knowledge: Fair  Language: Fair  Akathisia:  No  Handed:  Right  AIMS (if indicated): denies tremors, rigidity,stiffness  Assets:  Communication Skills Desire for Improvement Housing Social Support Talents/Skills Transportation  ADL's:  Intact  Cognition: WNL  Sleep:  improving   Screenings:   Assessment and Plan: Teandra is a 38 year old female, married, employed, lives in [place], has a history of PMDD, 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  Patient is biologically predisposed given her substance abuse problems as well as her family history of mental health problems and history of trauma.  She also has psychosocial stressors of legal problems, being separated from her 26 year old son and son.  Patient continues to struggle with mood lability especially towards the time of her menstrual period.  And also with side effects to Paxil.  Hence discussed the following medication readjustment.  Plan PMDD- worsening Taper of Paxil.  She will start taking 10 mg for the next 1 week, alternate days.  she will stop it after a week. Start Effexor extended release 37.5 mg daily with breakfast.  For insomnia-improving Trazodone 50 to 100 mg p.o. nightly.  She reports she rarely uses it. Hydroxyzine as needed at 25 to 50 mg p.o. 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  She has been sober since the past 11 months.  For tobacco use disorder Provided smoking cessation counseling.  Patient is interested in [employer] testing today-will order the  same.  Will provide her lab slip to get TSH-she will go to [facility].  Pt to continue CBT.  I have spent atleast 25 minutes face to face with patient today. More than 50 % of the time was spent for psychoeducation and supportive psychotherapy and care coordination.  This note was generated in part or whole with voice recognition software. Voice recognition is usually quite accurate but there are transcription errors that can and very often do occur. I apologize for any typographical errors that were not detected and corrected.            Jomarie Longs, MD 10/19/2018, 5:47 PM

## 2018-10-19 NOTE — Patient Instructions (Signed)
Venlafaxine extended-release capsules  What is this medicine?  VENLAFAXINE(VEN la fax een) is used to treat depression, anxiety and panic disorder.  This medicine may be used for other purposes; ask your health care provider or pharmacist if you have questions.  COMMON BRAND NAME(S): Effexor XR  What should I tell my health care provider before I take this medicine?  They need to know if you have any of these conditions:  -bleeding disorders  -glaucoma  -heart disease  -high blood pressure  -high cholesterol  -kidney disease  -liver disease  -low levels of sodium in the blood  -mania or bipolar disorder  -seizures  -suicidal thoughts, plans, or attempt; a previous suicide attempt by you or a family  -take medicines that treat or prevent blood clots  -thyroid disease  -an unusual or allergic reaction to venlafaxine, desvenlafaxine, other medicines, foods, dyes, or preservatives  -pregnant or trying to get pregnant  -breast-feeding  How should I use this medicine?  Take this medicine by mouth with a full glass of water. Follow the directions on the prescription label. Do not cut, crush, or chew this medicine. Take it with food. If needed, the capsule may be carefully opened and the entire contents sprinkled on a spoonful of cool applesauce. Swallow the applesauce/pellet mixture right away without chewing and follow with a glass of water to ensure complete swallowing of the pellets. Try to take your medicine at about the same time each day. Do not take your medicine more often than directed. Do not stop taking this medicine suddenly except upon the advice of your doctor. Stopping this medicine too quickly may cause serious side effects or your condition may worsen.  A special MedGuide will be given to you by the pharmacist with each prescription and refill. Be sure to read this information carefully each time.  Talk to your pediatrician regarding the use of this medicine in children. Special care may be  needed.  Overdosage: If you think you have taken too much of this medicine contact a poison control center or emergency room at once.  NOTE: This medicine is only for you. Do not share this medicine with others.  What if I miss a dose?  If you miss a dose, take it as soon as you can. If it is almost time for your next dose, take only that dose. Do not take double or extra doses.  What may interact with this medicine?  Do not take this medicine with any of the following medications:  -certain medicines for fungal infections like fluconazole, itraconazole, ketoconazole, posaconazole, voriconazole  -cisapride  -desvenlafaxine  -dofetilide  -dronedarone  -duloxetine  -levomilnacipran  -linezolid  -MAOIs like Carbex, Eldepryl, Marplan, Nardil, and Parnate  -methylene blue (injected into a vein)  -milnacipran  -pimozide  -thioridazine  -ziprasidone  This medicine may also interact with the following medications:  -amphetamines  -aspirin and aspirin-like medicines  -certain medicines for depression, anxiety, or psychotic disturbances  -certain medicines for migraine headaches like almotriptan, eletriptan, frovatriptan, naratriptan, rizatriptan, sumatriptan, zolmitriptan  -certain medicines for sleep  -certain medicines that treat or prevent blood clots like dalteparin, enoxaparin, warfarin  -cimetidine  -clozapine  -diuretics  -fentanyl  -furazolidone  -indinavir  -isoniazid  -lithium  -metoprolol  -NSAIDS, medicines for pain and inflammation, like ibuprofen or naproxen  -other medicines that prolong the QT interval (cause an abnormal heart rhythm)  -procarbazine  -rasagiline  -supplements like St. John's wort, kava kava, valerian  -tramadol  -tryptophan    This list may not describe all possible interactions. Give your health care provider a list of all the medicines, herbs, non-prescription drugs, or dietary supplements you use. Also tell them if you smoke, drink alcohol, or use illegal drugs. Some items may interact with  your medicine.  What should I watch for while using this medicine?  Tell your doctor if your symptoms do not get better or if they get worse. Visit your doctor or health care professional for regular checks on your progress. Because it may take several weeks to see the full effects of this medicine, it is important to continue your treatment as prescribed by your doctor.  Patients and their families should watch out for new or worsening thoughts of suicide or depression. Also watch out for sudden changes in feelings such as feeling anxious, agitated, panicky, irritable, hostile, aggressive, impulsive, severely restless, overly excited and hyperactive, or not being able to sleep. If this happens, especially at the beginning of treatment or after a change in dose, call your health care professional.  This medicine can cause an increase in blood pressure. Check with your doctor for instructions on monitoring your blood pressure while taking this medicine.  You may get drowsy or dizzy. Do not drive, use machinery, or do anything that needs mental alertness until you know how this medicine affects you. Do not stand or sit up quickly, especially if you are an older patient. This reduces the risk of dizzy or fainting spells. Alcohol may interfere with the effect of this medicine. Avoid alcoholic drinks.  Your mouth may get dry. Chewing sugarless gum, sucking hard candy and drinking plenty of water will help. Contact your doctor if the problem does not go away or is severe.  What side effects may I notice from receiving this medicine?  Side effects that you should report to your doctor or health care professional as soon as possible:  -allergic reactions like skin rash, itching or hives, swelling of the face, lips, or tongue  -anxious  -breathing problems  -confusion  -changes in vision  -chest pain  -confusion  -elevated mood, decreased need for sleep, racing thoughts, impulsive behavior  -eye pain  -fast, irregular  heartbeat  -feeling faint or lightheaded, falls  -feeling agitated, angry, or irritable  -hallucination, loss of contact with reality  -high blood pressure  -loss of balance or coordination  -palpitations  -redness, blistering, peeling or loosening of the skin, including inside the mouth  -restlessness, pacing, inability to keep still  -seizures  -stiff muscles  -suicidal thoughts or other mood changes  -trouble passing urine or change in the amount of urine  -trouble sleeping  -unusual bleeding or bruising  -unusually weak or tired  -vomiting  Side effects that usually do not require medical attention (report to your doctor or health care professional if they continue or are bothersome):  -change in sex drive or performance  -change in appetite or weight  -constipation  -dizziness  -dry mouth  -headache  -increased sweating  -nausea  -tired  This list may not describe all possible side effects. Call your doctor for medical advice about side effects. You may report side effects to FDA at 1-800-FDA-1088.  Where should I keep my medicine?  Keep out of the reach of children.  Store at a controlled temperature between 20 and 25 degrees C (68 degrees and 77 degrees F), in a dry place. Throw away any unused medicine after the expiration date.  NOTE: This sheet is a   summary. It may not cover all possible information. If you have questions about this medicine, talk to your doctor, pharmacist, or health care provider.   2019 Elsevier/Gold Standard (2016-02-28 18:38:02)

## 2018-10-20 LAB — VITAMIN B12: Vitamin B-12: 525 pg/mL (ref 232–1245)

## 2018-10-20 LAB — TSH: TSH: 1.07 u[IU]/mL (ref 0.450–4.500)

## 2018-10-21 ENCOUNTER — Ambulatory Visit (INDEPENDENT_AMBULATORY_CARE_PROVIDER_SITE_OTHER): Payer: BLUE CROSS/BLUE SHIELD | Admitting: Licensed Clinical Social Worker

## 2018-10-21 DIAGNOSIS — F3281 Premenstrual dysphoric disorder: Secondary | ICD-10-CM | POA: Diagnosis not present

## 2018-10-22 NOTE — Progress Notes (Signed)
   THERAPIST PROGRESS NOTE  Session Time: 60 min  Participation Level: Active  Behavioral Response: CasualAlertEuthymic  Type of Therapy: Individual Therapy  Treatment Goals addressed: Coping  Interventions: Supportive  Summary: Jasmine [name] is a 38 y.o. female who presents to her session.  Therapist introduced self and discussed role in treatment.  Therapist reviewed OPT service and discussed expectations.  Therapist prompted Patient to discuss her goals and symptoms. Therapist met with Patient in an initial therapy session to assess current mood and to build rapport. Therapist engaged Patient in discussion about her life and what is going well for her. Therapist provided support for Patient as she shared details about her life, her current stressors, mood, coping skills, her past, and her children. Therapist prompted Patient to discuss her support system and ways that she manages her daily stress, anger, and frustrations.    Suicidal/Homicidal: No   Plan: Return again in 2 weeks.  Diagnosis: Axis I: Premenstrual Dysphoric Disorder    Axis II: No diagnosis    Lubertha South, LCSW 09/06/2018

## 2018-10-22 NOTE — Progress Notes (Signed)
   THERAPIST PROGRESS NOTE  Session Time: 60 min  Participation Level: Active  Behavioral Response: CasualAlertEuthymic  Type of Therapy: Individual Therapy  Treatment Goals addressed: Coping  Interventions: CBT and Motivational Interviewing  Summary: Jasmine [name] is a 38 y.o. female who presents with a reduction in symptoms.  Patient reports a good mood and positive recent past.  Therapist explored therapeutic ways to manage her symptoms and attempted to reassure Patient that she currently has support as we build an alliance. Therapist explained cognitive development to Patient then challenged her thinking using a quick exercise.   Therapist continued with cognitive distortions and irrational thoughts.  Therapist and Patient reviewed emotions and ways that our thoughts overshadow and influence our emotions.  Therapist prompted Patient to provide an example of a time when her thought process influenced the way she felt.    Suicidal/Homicidal: No  Plan: Return again in2 weeks.  Diagnosis: Axis I: PMDD    Axis II: No diagnosis    Marinda Elk, LCSW 10/22/2018

## 2018-10-28 ENCOUNTER — Ambulatory Visit: Payer: BLUE CROSS/BLUE SHIELD | Admitting: Psychiatry

## 2018-11-03 ENCOUNTER — Ambulatory Visit (INDEPENDENT_AMBULATORY_CARE_PROVIDER_SITE_OTHER): Payer: BLUE CROSS/BLUE SHIELD | Admitting: Licensed Clinical Social Worker

## 2018-11-03 DIAGNOSIS — F3281 Premenstrual dysphoric disorder: Secondary | ICD-10-CM

## 2018-11-16 ENCOUNTER — Encounter: Payer: Self-pay | Admitting: Psychiatry

## 2018-11-16 ENCOUNTER — Other Ambulatory Visit: Payer: Self-pay

## 2018-11-16 ENCOUNTER — Ambulatory Visit: Payer: BLUE CROSS/BLUE SHIELD | Admitting: Psychiatry

## 2018-11-16 VITALS — BP 143/96 | HR 73 | Temp 97.6°F | Wt 240.0 lb

## 2018-11-16 DIAGNOSIS — F1121 Opioid dependence, in remission: Secondary | ICD-10-CM

## 2018-11-16 DIAGNOSIS — F3281 Premenstrual dysphoric disorder: Secondary | ICD-10-CM

## 2018-11-16 DIAGNOSIS — F17200 Nicotine dependence, unspecified, uncomplicated: Secondary | ICD-10-CM | POA: Diagnosis not present

## 2018-11-16 NOTE — Progress Notes (Signed)
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  Patient today reports she is tolerating the Effexor well.  She denies any side effects to the medication.  She has been taking it since the past 2 to 3 weeks.  She reports she did not have her menstrual period after starting the medication.  Her mood symptoms usually gets worse towards the time of her period.  She reports she hence has not noticed much change on the Effexor.  She does not want a dosage increase today and wants to give it time.  Patient reports her sleep has improved on the trazodone.  She continues to stay sober from opioids and reports she is 12 months sober today.  She is very proud of herself.  Patient denies any suicidality, homicidality or perceptual disturbances.  Patient denies any other concerns today. Visit Diagnosis:    ICD-10-CM   1. PMDD (premenstrual dysphoric disorder) F32.81   2. Moderate opioid use disorder, in sustained remission (HCC) F11.21   3. Tobacco use disorder F17.200     Past Psychiatric History: Reviewed past psychiatric history from my progress note on 07/28/2018.  Past trials of Prozac, Seroquel, Paxil  Past Medical History:  Past Medical History:  Diagnosis Date  . Hep C w/o coma, chronic (HCC)   . Substance abuse [facility](HCC)     Past Surgical History:  Procedure Laterality Date  . KNEE ARTHROCENTESIS Left   . PATELLECTOMY    . TUBAL LIGATION N/A   . TUBAL LIGATION      Family Psychiatric History: Reviewed family psychiatric history from my progress note on 07/28/2018.  Family History:  Family History  Problem Relation Age of Onset  . Depression Mother    . Depression Brother     Social History: Reviewed social history from my progress note on 07/28/2018. Social History   Socioeconomic History  . Marital status: Married    Spouse name: rodney  . Number of children: 2  . Years of education: Not on file  . Highest education level: Some college, no degree  Occupational History  . Not on file  Social Needs  . Financial resource strain: Not hard at all  . Food insecurity:    Worry: Never true    Inability: Never true  . Transportation needs:    Medical: No    Non-medical: No  Tobacco Use  . Smoking status: Current Every Day Smoker    Packs/day: 1.00    Types: Cigarettes  . Smokeless tobacco: Never Used  Substance and Sexual Activity  . Alcohol use: Not Currently    Frequency: Never    Comment: occ  . Drug use: Not Currently    Types: Heroin, IV    Comment: heroin  . Sexual activity: Yes  Lifestyle  . Physical activity:    Days per week: 0 days    Minutes per session: 0 min  . Stress: Very much  Relationships  . Social connections:    Talks on phone: Not on file    Gets together: Not on file    Attends religious service: More than 4 times per year  Active member of club or organization: No    Attends meetings of clubs or organizations: Never    Relationship status: Married  Other Topics Concern  . Not on file  Social History Narrative   ** Merged History Encounter **        Allergies:  Allergies  Allergen Reactions  . Hydrocodone-Acetaminophen     Metabolic Disorder Labs: No results found for: HGBA1C, MPG No results found for: PROLACTIN No results found for: CHOL, TRIG, HDL, CHOLHDL, VLDL, LDLCALC Lab Results  Component Value Date   TSH 1.070 10/19/2018   TSH 1.151 11/23/2017    Therapeutic Level Labs: No results found for: LITHIUM No results found for: VALPROATE No components found for:  CBMZ  Current Medications: Current Outpatient Medications  Medication Sig Dispense Refill  . Baclofen 5  MG TABS Take 1 tablet by mouth at bedtime as needed. 7 tablet 0  . hydrOXYzine (VISTARIL) 25 MG capsule TAKE 1-2 CAPSULES (25-50 MG TOTAL) BY MOUTH AT BEDTIME AS NEEDED. FOR SLEEP 180 capsule 1  . ibuprofen (ADVIL,MOTRIN) 800 MG tablet Take 1 tablet (800 mg total) by mouth every 8 (eight) hours as needed for moderate pain. 15 tablet 0  . naloxone (NARCAN) 0.4 MG/ML injection Inject 1 mL (0.4 mg total) into the skin as needed. 1 mL 1  . predniSONE (DELTASONE) 10 MG tablet Take 6 tablets  today, on day 2 take 5 tablets, day 3 take 4 tablets, day 4 take 3 tablets, day 5 take  2 tablets and 1 tablet the last day 21 tablet 0  . traZODone (DESYREL) 50 MG tablet TAKE ONE AT BEDTIME NIGHTLY AS NEEDED FOR SLEEP    . venlafaxine XR (EFFEXOR-XR) 37.5 MG 24 hr capsule Take 1 capsule (37.5 mg total) by mouth daily with breakfast. For mood symptoms 30 capsule 1   No current facility-administered medications for this visit.      Musculoskeletal: Strength & Muscle Tone: within normal limits Gait & Station: normal Patient leans: N/A  Psychiatric Specialty Exam: Review of Systems  Psychiatric/Behavioral: Negative for hallucinations, substance abuse and suicidal ideas. The patient is nervous/anxious and has insomnia (improving).   All other systems reviewed and are negative.   Blood pressure (!) 143/96, pulse 73, temperature 97.6 F (36.4 C), temperature source Oral, weight 240 lb (108.9 kg).Body mass index is 34.44 kg/m.  General Appearance: Casual  Eye Contact:  Fair  Speech:  Clear and Coherent  Volume:  Normal  Mood:  Euthymic  Affect:  Congruent  Thought Process:  Goal Directed and Descriptions of Associations: Intact  Orientation:  Full (Time, Place, and Person)  Thought Content: Logical   Suicidal Thoughts:  No  Homicidal Thoughts:  No  Memory:  Immediate;   Fair Recent;   Fair Remote;   Fair  Judgement:  Fair  Insight:  Fair  Psychomotor Activity:  Normal  Concentration:   Concentration: Fair and Attention Span: Fair  Recall:  [employer]Fair  Fund of Knowledge: Fair  Language: Fair  Akathisia:  No  Handed:  Right  AIMS (if indicated): denies tremors, rigidity,stiffness  Assets:  Communication Skills Desire for Improvement Social Support  ADL's:  Intact  Cognition: WNL  Sleep:  improving   Screenings:   Assessment and Plan: Jasmine [name] is a 89 old female, married, lives in [place], has a history of PMDD, 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  Patient is biologically predisposed given  her substance abuse problems as well as her family history of mental health problems and trauma.  She has psychosocial stressors of legal problems, being separated from her 31 year old son.  Patient is compliant on her Effexor.  Will continue plan as noted below.  Plan PMDD- unstable Continue Effexor extended release 37.5 mg p.o. daily with breakfast. Patient has not had her menstrual periods since being on medication and hence wants to wait.  For insomnia-improving Trazodone 50 to 100 mg p.o. nightly. Hydroxyzine 25 to 50 mg p.o. nightly as needed  Opioid use disorder in sustained remission Patient has been sober since the past 12 months.  For tobacco use disorder-unstable Provided smoking cessation counseling.  Pending labs-TSH.  Patient will continue psychotherapy sessions.  Follow-up in clinic in 2 weeks or sooner if needed.  I have spent atleast 15 minutes face to face with patient today. More than 50 % of the time was spent for psychoeducation and supportive psychotherapy and care coordination.  This note was generated in part or whole with voice recognition software. Voice recognition is usually quite accurate but there are transcription errors that can and very often do occur. I apologize for any typographical errors that were not detected and corrected.      Jomarie Longs,  MD 11/16/2018, 3:47 PM

## 2018-11-17 ENCOUNTER — Ambulatory Visit (INDEPENDENT_AMBULATORY_CARE_PROVIDER_SITE_OTHER): Payer: BLUE CROSS/BLUE SHIELD | Admitting: Licensed Clinical Social Worker

## 2018-11-17 DIAGNOSIS — F1121 Opioid dependence, in remission: Secondary | ICD-10-CM

## 2018-11-17 DIAGNOSIS — F3281 Premenstrual dysphoric disorder: Secondary | ICD-10-CM

## 2018-12-01 ENCOUNTER — Ambulatory Visit (INDEPENDENT_AMBULATORY_CARE_PROVIDER_SITE_OTHER): Payer: BLUE CROSS/BLUE SHIELD | Admitting: Psychiatry

## 2018-12-01 ENCOUNTER — Encounter: Payer: Self-pay | Admitting: Psychiatry

## 2018-12-01 ENCOUNTER — Other Ambulatory Visit: Payer: Self-pay

## 2018-12-01 VITALS — BP 120/77 | HR 75 | Temp 97.4°F | Wt 242.6 lb

## 2018-12-01 DIAGNOSIS — F5105 Insomnia due to other mental disorder: Secondary | ICD-10-CM

## 2018-12-01 DIAGNOSIS — F3281 Premenstrual dysphoric disorder: Secondary | ICD-10-CM | POA: Diagnosis not present

## 2018-12-01 DIAGNOSIS — F17200 Nicotine dependence, unspecified, uncomplicated: Secondary | ICD-10-CM

## 2018-12-01 DIAGNOSIS — F1121 Opioid dependence, in remission: Secondary | ICD-10-CM

## 2018-12-01 MED ORDER — VENLAFAXINE HCL ER 75 MG PO CP24: 75.0000 mg | ORAL_CAPSULE | Freq: Every day | ORAL | 0 refills | Status: DC

## 2018-12-01 NOTE — Progress Notes (Signed)
BH MD OP Progress Note  12/01/2018 5:30 PM Jasmine FairlyLaurie Ann [name]  MRN:  [id number]  Chief Complaint: ' I am here for follow up.' Chief Complaint    Follow-up; Medication Refill; Weight Gain     HPI: Jasmine ConesLaurie is a 38 year old female, employed, married, [place], has a history of PMDD, opiate use disorder in sustained remission, tobacco use disorder, hepatitis C, presented to clinic today for a follow-up visit.  Patient reports she continues to be compliant with her Effexor.  She however reports she has not had her menstrual period yet and hence does not know if the Effexor is beneficial or not.  She reports she has noticed some mood lability the past couple of days and is thinking that it could be because she is getting close to her menstrual period.  Discussed with patient that her Effexor can be increased and she agrees with plan.  Patient reports sleep is good.  She does not take the trazodone much.  She continues to stay sober from opioids.  She continues to have psychosocial stressors of custody battle of her son which is going on at this time.  She has upcoming court hearing in May.  Some time was spent discussing her GeneSight testing results that were reported.   Visit Diagnosis:    ICD-10-CM   1. PMDD (premenstrual dysphoric disorder) F32.81 venlafaxine XR (EFFEXOR XR) 75 MG 24 hr capsule  2. Moderate opioid use disorder, in sustained remission (HCC) F11.21   3. Tobacco use disorder F17.200   4. Insomnia due to mental condition F51.05     Past Psychiatric History: Reviewed past psychiatric history from my progress note on 07/28/2018.  Past trials of Prozac, Seroquel, Paxil.  Past Medical History:  Past Medical History:  Diagnosis Date  . Hep C w/o coma, chronic (HCC)   . Substance abuse [facility](HCC)     Past Surgical History:  Procedure Laterality Date  . KNEE ARTHROCENTESIS Left   . PATELLECTOMY    . TUBAL LIGATION N/A   . TUBAL LIGATION      Family Psychiatric  History: Reviewed family psychiatric history from my progress note on 07/28/2018.  Family History:  Family History  Problem Relation Age of Onset  . Depression Mother   . Depression Brother     Social History: Reviewed social history from my progress note on 07/28/2018. Social History   Socioeconomic History  . Marital status: Married    Spouse name: rodney  . Number of children: 2  . Years of education: Not on file  . Highest education level: Some college, no degree  Occupational History  . Not on file  Social Needs  . Financial resource strain: Not hard at all  . Food insecurity:    Worry: Never true    Inability: Never true  . Transportation needs:    Medical: No    Non-medical: No  Tobacco Use  . Smoking status: Current Every Day Smoker    Packs/day: 1.00    Types: Cigarettes  . Smokeless tobacco: Never Used  Substance and Sexual Activity  . Alcohol use: Not Currently    Frequency: Never    Comment: occ  . Drug use: Not Currently    Types: Heroin, IV    Comment: heroin  . Sexual activity: Yes  Lifestyle  . Physical activity:    Days per week: 0 days    Minutes per session: 0 min  . Stress: Very much  Relationships  . Social connections:  Talks on phone: Not on file    Gets together: Not on file    Attends religious service: More than 4 times per year    Active member of club or organization: No    Attends meetings of clubs or organizations: Never    Relationship status: Married  Other Topics Concern  . Not on file  Social History Narrative   ** Merged History Encounter **        Allergies:  Allergies  Allergen Reactions  . Hydrocodone-Acetaminophen     Metabolic Disorder Labs: No results found for: HGBA1C, MPG No results found for: PROLACTIN No results found for: CHOL, TRIG, HDL, CHOLHDL, VLDL, LDLCALC Lab Results  Component Value Date   TSH 1.070 10/19/2018   TSH 1.151 11/23/2017    Therapeutic Level Labs: No results found for:  LITHIUM No results found for: VALPROATE No components found for:  CBMZ  Current Medications: Current Outpatient Medications  Medication Sig Dispense Refill  . Baclofen 5 MG TABS Take 1 tablet by mouth at bedtime as needed. 7 tablet 0  . hydrOXYzine (VISTARIL) 25 MG capsule TAKE 1-2 CAPSULES (25-50 MG TOTAL) BY MOUTH AT BEDTIME AS NEEDED. FOR SLEEP 180 capsule 1  . ibuprofen (ADVIL,MOTRIN) 800 MG tablet Take 1 tablet (800 mg total) by mouth every 8 (eight) hours as needed for moderate pain. 15 tablet 0  . naloxone (NARCAN) 0.4 MG/ML injection Inject 1 mL (0.4 mg total) into the skin as needed. 1 mL 1  . predniSONE (DELTASONE) 10 MG tablet Take 6 tablets  today, on day 2 take 5 tablets, day 3 take 4 tablets, day 4 take 3 tablets, day 5 take  2 tablets and 1 tablet the last day 21 tablet 0  . traZODone (DESYREL) 50 MG tablet TAKE ONE AT BEDTIME NIGHTLY AS NEEDED FOR SLEEP    . venlafaxine XR (EFFEXOR XR) 75 MG 24 hr capsule Take 1 capsule (75 mg total) by mouth daily with breakfast. 90 capsule 0   No current facility-administered medications for this visit.      Musculoskeletal: Strength & Muscle Tone: within normal limits Gait & Station: normal Patient leans: N/A  Psychiatric Specialty Exam: Review of Systems  Psychiatric/Behavioral: The patient is nervous/anxious.   All other systems reviewed and are negative.   Blood pressure 120/77, pulse 75, temperature (!) 97.4 F (36.3 C), temperature source Oral, weight 242 lb 9.6 oz (110 kg).Body mass index is 34.81 kg/m.  General Appearance: Casual  Eye Contact:  Fair  Speech:  Clear and Coherent  Volume:  Normal  Mood:  Euthymic  Affect:  Congruent  Thought Process:  Goal Directed and Descriptions of Associations: Intact  Orientation:  Full (Time, Place, and Person)  Thought Content: Logical   Suicidal Thoughts:  No  Homicidal Thoughts:  No  Memory:  Immediate;   Fair Recent;   Fair Remote;   Fair  Judgement:  Fair  Insight:   Fair  Psychomotor Activity:  Normal  Concentration:  Concentration: Fair and Attention Span: Fair  Recall:  [employer] of Knowledge: Fair  Language: Fair  Akathisia:  No  Handed:  Right  AIMS (if indicated):denies tremors, rigidity,stiffness  Assets:  Communication Skills Desire for Improvement Social Support  ADL's:  Intact  Cognition: WNL  Sleep:  Fair   Screenings:   Assessment and Plan: Kandace Blitz is a 38 year old female, married, lives in [place], has a history of PMDD, heroin use disorder in remission, hepatitis C, tobacco use disorder, presented  to clinic today for a follow-up visit.  Patient is biologically predisposed given her history of substance abuse problems, family history of mental health problems and trauma.  Patient also has psychosocial stressors of legal problems, being separated from her 40 year old son.  Patient continues to be compliant on her medications however will benefit from dose readjustment today.  Plan PMDD- unstable Increase Effexor extended release to 75 mg p.o. daily with breakfast Will monitor closely.  For insomnia-improving She rarely takes trazodone. Hydroxyzine 25 to 50 mg p.o. nightly as needed  Opioid use disorder in remission We will continue to monitor closely.  Tobacco use disorder-unstable Provided smoking cessation counseling.  I have reviewed the following labs-TSH- within normal limits-dated 10/19/2018, vitamin B12-within normal limits-dated 10/19/2018  Some time was spent discussing GeneSight testing results which were reported.  Follow-up in clinic in 4 to 5 weeks or sooner if needed.  I have spent atleast 25 minutes face to face with patient today. More than 50 % of the time was spent for psychoeducation and supportive psychotherapy and care coordination.  This note was generated in part or whole with voice recognition software. Voice recognition is usually quite accurate but there are transcription errors that can and very often  do occur. I apologize for any typographical errors that were not detected and corrected.        Jomarie Longs, MD 12/01/2018, 5:30 PM

## 2018-12-07 ENCOUNTER — Ambulatory Visit: Payer: BLUE CROSS/BLUE SHIELD | Admitting: Licensed Clinical Social Worker

## 2018-12-20 ENCOUNTER — Other Ambulatory Visit: Payer: Self-pay

## 2018-12-20 ENCOUNTER — Ambulatory Visit
Admission: EM | Admit: 2018-12-20 | Discharge: 2018-12-20 | Disposition: A | Discharge status: Home / Self Care | Payer: BLUE CROSS/BLUE SHIELD | Attending: Family Medicine | Admitting: Family Medicine

## 2018-12-20 ENCOUNTER — Ambulatory Visit (INDEPENDENT_AMBULATORY_CARE_PROVIDER_SITE_OTHER): Payer: BLUE CROSS/BLUE SHIELD

## 2018-12-20 DIAGNOSIS — M542 Cervicalgia: Secondary | ICD-10-CM

## 2018-12-20 DIAGNOSIS — M25562 Pain in left knee: Secondary | ICD-10-CM | POA: Diagnosis not present

## 2018-12-20 DIAGNOSIS — S8002XA Contusion of left knee, initial encounter: Secondary | ICD-10-CM | POA: Diagnosis not present

## 2018-12-20 DIAGNOSIS — S161XXA Strain of muscle, fascia and tendon at neck level, initial encounter: Secondary | ICD-10-CM

## 2018-12-20 MED ORDER — CYCLOBENZAPRINE HCL 10 MG PO TABS: 10.0000 mg | ORAL_TABLET | Freq: Every day | ORAL | 0 refills | Status: DC
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
## 2019-01-03 ENCOUNTER — Ambulatory Visit: Payer: BLUE CROSS/BLUE SHIELD | Admitting: Licensed Clinical Social Worker

## 2019-01-03 NOTE — Progress Notes (Signed)
   THERAPIST PROGRESS NOTE  Session Time:  Participation Level: Active  Behavioral Response: CasualAlertAnxious  Type of Therapy: Individual Therapy  Treatment Goals addressed: Anxiety and Coping  Interventions: CBT and Motivational Interviewing  Summary: Jasmine [name] is a 38 y.o. female who presents with continued symptoms of her diagnosis.  Therapist promoted an open discussion with Patient on what she feels that her biggest setbacks are.  Therapist instructed Patient to make a list of her weaknesses and a separate list for her strengths.  Therapist reviewed list and asked Patient to rate both strengths and weaknesses from most significant to the least.  Therapist urged Patient to consider her listed strengths and how they could assist her in overcoming her listed weaknesses.  Therapist actively listened as Patient talked about her weaknesses.  Therapist identified Patient's listed strengths that could be used to combat her listed weaknesses.  Therapist challenged Patient to keep a tally of the things that she uses her strengths for throughout the week.    Suicidal/Homicidal: No  Plan: Return again in 2 weeks.  Diagnosis: Axis I: PMDD    Axis II: No diagnosis    Marinda Elk, LCSW 11/03/2018

## 2019-01-05 ENCOUNTER — Ambulatory Visit: Payer: BLUE CROSS/BLUE SHIELD | Admitting: Psychiatry

## 2019-01-06 NOTE — Progress Notes (Signed)
   THERAPIST PROGRESS NOTE  Session Time: 60 min  Participation Level: Active  Behavioral Response: CasualAlertEuthymic  Type of Therapy: Individual Therapy  Treatment Goals addressed: Coping  Interventions: CBT and Motivational Interviewing  Summary: Jasmine [name] is a 38 y.o. female who presents with continued symptoms of diagnosis.  Therapist assisted Patient with processing her mood, current stressors. Therapist transitioned to discuss substance usage. Therapist inquired about recent drug usage. Therapist added that introducing substances only complicates her current issues making it even more complex to sort through the issues that she is trying to manage.  Therapist explained the temporary fix, along with the prolonged effects of the drugs, which after her body adjusts, will need to be increased to even stronger drugs.  Therapist encouraged Patient to utilize her natural supports for support when she is feeling down, misunderstood, or indifferent.  Therapist and Patient reviewed other learned coping skills from prior sessions. Therapist and Patient discussed journaling, listening to music, cleaning, going to her friend's home to visit, spending time with her children, etc.     Suicidal/Homicidal: No  Plan: Return again in 2 weeks.  Diagnosis: Axis I: PMDD    Axis II: No diagnosis    Marinda Elk, LCSW 11/17/2018

## 2019-03-16 ENCOUNTER — Other Ambulatory Visit: Payer: Self-pay | Admitting: Psychiatry

## 2019-03-16 DIAGNOSIS — F3281 Premenstrual dysphoric disorder: Secondary | ICD-10-CM

## 2019-03-17 ENCOUNTER — Encounter: Payer: Self-pay | Admitting: Psychiatry

## 2019-03-17 ENCOUNTER — Other Ambulatory Visit: Payer: Self-pay

## 2019-03-17 ENCOUNTER — Ambulatory Visit (INDEPENDENT_AMBULATORY_CARE_PROVIDER_SITE_OTHER): Payer: BLUE CROSS/BLUE SHIELD | Admitting: Psychiatry

## 2019-03-17 DIAGNOSIS — F5105 Insomnia due to other mental disorder: Secondary | ICD-10-CM

## 2019-03-17 DIAGNOSIS — F3281 Premenstrual dysphoric disorder: Secondary | ICD-10-CM | POA: Diagnosis not present

## 2019-03-17 DIAGNOSIS — F17200 Nicotine dependence, unspecified, uncomplicated: Secondary | ICD-10-CM

## 2019-03-17 DIAGNOSIS — F1121 Opioid dependence, in remission: Secondary | ICD-10-CM | POA: Diagnosis not present

## 2019-03-17 MED ORDER — HYDROXYZINE PAMOATE 25 MG PO CAPS: 25.0000 mg | ORAL_CAPSULE | Freq: Every evening | ORAL | 1 refills | Status: DC | PRN

## 2019-03-17 MED ORDER — VENLAFAXINE HCL ER 75 MG PO CP24: 75.0000 mg | ORAL_CAPSULE | Freq: Every day | ORAL | 1 refills | Status: DC

## 2019-03-17 MED ORDER — TRAZODONE HCL 50 MG PO TABS: ORAL_TABLET | ORAL | 1 refills | Status: DC

## 2019-03-17 NOTE — Progress Notes (Signed)
Virtual Visit via Video Note  I connected with Jasmine [name] on 03/17/19 at  3:30 PM EDT by a video enabled telemedicine application and verified that I am speaking with the correct person using two identifiers.   I discussed the limitations of evaluation and management by telemedicine and the availability of in person appointments. The patient expressed understanding and agreed to proceed.    I discussed the assessment and treatment plan with the patient. The patient was provided an opportunity to ask questions and all were answered. The patient agreed with the plan and demonstrated an understanding of the instructions.   The patient was advised to call back or seek an in-person evaluation if the symptoms worsen or if the condition fails to improve as anticipated.    BH MD OP Progress Note  03/17/2019 3:40 PM Jasmine FairlyLaurie Ann Orihuela  MRN:  [id number]  Chief Complaint:  Chief Complaint    Follow-up     HPI: Jasmine [name] is a 38 year old female, employed, married, lives in [place], has a history of PMDD, opiate use disorder in sustained remission, tobacco use disorder, hepatitis C was evaluated by telemedicine today.  Patient today reports she is happy that she got her son back.  She reports she got full custody of her son and he has been living with her since the past 3 months or so.  Her son's school closed last month.  And they have been spending a lot of time together.  Patient reports she is compliant on her Effexor.  She reports the Effexor helps her a lot with her mood symptoms.  She denies any significant sadness, anxiety symptoms or crying spells.  She reports sleep is good on the hydroxyzine.  She continues to take trazodone as needed.  Patient continues to stay sober from opioids and any other illicit drugs or alcohol.  She continues to smoke cigarettes however has been working to cut back.  Patient denies any suicidality, homicidality or perceptual disturbances. Visit Diagnosis:     ICD-10-CM   1. PMDD (premenstrual dysphoric disorder) F32.81 venlafaxine XR (EFFEXOR XR) 75 MG 24 hr capsule    traZODone (DESYREL) 50 MG tablet    hydrOXYzine (VISTARIL) 25 MG capsule   improving  2. Moderate opioid use disorder, in sustained remission (HCC) F11.21   3. Tobacco use disorder F17.200   4. Insomnia due to mental condition F51.05 traZODone (DESYREL) 50 MG tablet   improving    Past Psychiatric History: Reviewed past psychiatric history from my progress note on 07/28/2018.  Past trials of Prozac, Seroquel, Paxil  Past Medical History:  Past Medical History:  Diagnosis Date  . Hep C w/o coma, chronic (HCC)   . Substance abuse [facility](HCC)     Past Surgical History:  Procedure Laterality Date  . KNEE ARTHROCENTESIS Left   . PATELLECTOMY    . TUBAL LIGATION N/A   . TUBAL LIGATION      Family Psychiatric History: Reviewed family psychiatric history from my progress note on 07/28/2018. Family History:  Family History  Problem Relation Age of Onset  . Depression Mother   . Depression Brother     Social History: Reviewed social history from my progress note on 07/28/2018 Social History   Socioeconomic History  . Marital status: Married    Spouse name: rodney  . Number of children: 2  . Years of education: Not on file  . Highest education level: Some college, no degree  Occupational History  . Not on file  Social Needs  .  Financial resource strain: Not hard at all  . Food insecurity:    Worry: Never true    Inability: Never true  . Transportation needs:    Medical: No    Non-medical: No  Tobacco Use  . Smoking status: Current Every Day Smoker    Packs/day: 1.00    Types: Cigarettes  . Smokeless tobacco: Never Used  Substance and Sexual Activity  . Alcohol use: Never    Frequency: Never  . Drug use: Not Currently    Types: Heroin, IV    Comment: heroin  . Sexual activity: Yes  Lifestyle  . Physical activity:    Days per week: 0 days    Minutes per  session: 0 min  . Stress: Very much  Relationships  . Social connections:    Talks on phone: Not on file    Gets together: Not on file    Attends religious service: More than 4 times per year    Active member of club or organization: No    Attends meetings of clubs or organizations: Never    Relationship status: Married  Other Topics Concern  . Not on file  Social History Narrative   ** Merged History Encounter **        Allergies:  Allergies  Allergen Reactions  . Hydrocodone-Acetaminophen     Metabolic Disorder Labs: No results found for: HGBA1C, MPG No results found for: PROLACTIN No results found for: CHOL, TRIG, HDL, CHOLHDL, VLDL, LDLCALC Lab Results  Component Value Date   TSH 1.070 10/19/2018   TSH 1.151 11/23/2017    Therapeutic Level Labs: No results found for: LITHIUM No results found for: VALPROATE No components found for:  CBMZ  Current Medications: Current Outpatient Medications  Medication Sig Dispense Refill  . Baclofen 5 MG TABS Take 1 tablet by mouth at bedtime as needed. 7 tablet 0  . cyclobenzaprine (FLEXERIL) 10 MG tablet Take 1 tablet (10 mg total) by mouth at bedtime. 30 tablet 0  . hydrOXYzine (VISTARIL) 25 MG capsule Take 1-2 capsules (25-50 mg total) by mouth at bedtime as needed. FOR SLEEP 180 capsule 1  . ibuprofen (ADVIL,MOTRIN) 800 MG tablet Take 1 tablet (800 mg total) by mouth every 8 (eight) hours as needed for moderate pain. 15 tablet 0  . naloxone (NARCAN) 0.4 MG/ML injection Inject 1 mL (0.4 mg total) into the skin as needed. 1 mL 1  . predniSONE (DELTASONE) 10 MG tablet Take 6 tablets  today, on day 2 take 5 tablets, day 3 take 4 tablets, day 4 take 3 tablets, day 5 take  2 tablets and 1 tablet the last day 21 tablet 0  . traZODone (DESYREL) 50 MG tablet TAKE ONE AT BEDTIME NIGHTLY AS NEEDED FOR SLEEP 90 tablet 1  . venlafaxine XR (EFFEXOR XR) 75 MG 24 hr capsule Take 1 capsule (75 mg total) by mouth daily with breakfast. 90 capsule  1   No current facility-administered medications for this visit.      Musculoskeletal: Strength & Muscle Tone: within normal limits Gait & Station: normal Patient leans: N/A  Psychiatric Specialty Exam: Review of Systems  Psychiatric/Behavioral: Negative for depression. The patient is not nervous/anxious.   All other systems reviewed and are negative.   There were no vitals taken for this visit.There is no height or weight on file to calculate BMI.  General Appearance: Casual  Eye Contact:  Fair  Speech:  Clear and Coherent  Volume:  Normal  Mood:  Euthymic  Affect:  Congruent  Thought Process:  Goal Directed and Descriptions of Associations: Intact  Orientation:  Full (Time, Place, and Person)  Thought Content: Logical   Suicidal Thoughts:  No  Homicidal Thoughts:  No  Memory:  Immediate;   Fair Recent;   Fair Remote;   Fair  Judgement:  Fair  Insight:  Fair  Psychomotor Activity:  Normal  Concentration:  Concentration: Fair and Attention Span: Fair  Recall:  [employer] of Knowledge: Fair  Language: Fair  Akathisia:  No  Handed:  Right  AIMS (if indicated): denies tremors, rigidity  Assets:  Communication Skills Desire for Improvement Social Support  ADL's:  Intact  Cognition: WNL  Sleep:  Fair   Screenings:   Assessment and Plan:Diesha is a 38 year old female, married, lives in [place], has a history of PMDD, heroin use disorder in remission, hepatitis C, tobacco use disorder was evaluated by telemedicine today.  Patient is biologically predisposed given her history of substance abuse problems as well as family history of mental health problems and trauma.  Patient has however been doing well and reports getting her son back which has been a major change in her life.  Patient remains compliant on medications.  Plan as noted below.  Plan PMDD- stable Effexor extended release 75 mg p.o. daily with breakfast  For insomnia-stable She rarely takes  trazodone Hydroxyzine 25 to 50 mg p.o. nightly as needed  For opioid use disorder in remission We will continue to monitor closely.  Tobacco use disorder-unstable Provided smoking cessation counseling.  She will continue to cut back.  Gene[employer] testing results were reviewed while making medication changes.  Follow-up in clinic in 3 months or sooner if needed.  Appointment scheduled for September 9 at 2:30 PM.  I have spent atleast 15 minutes non face to face with patient today. More than 50 % of the time was spent for psychoeducation and supportive psychotherapy and care coordination.  This note was generated in part or whole with voice recognition software. Voice recognition is usually quite accurate but there are transcription errors that can and very often do occur. I apologize for any typographical errors that were not detected and corrected.        Jomarie Longs, MD 03/17/2019, 3:40 PM

## 2019-06-10 IMAGING — CR DG THORACIC SPINE 2 VIEW
1 series · 3 of 3 positions shown · non-contrast
Comparison: None.

CLINICAL DATA: Back pain

EXAM:
THORACIC SPINE 2 VIEWS

[Series 1: dg thoracic spine 2 view · 0.14mm/px · 3 of 3 slices shown]
[im 1/3]
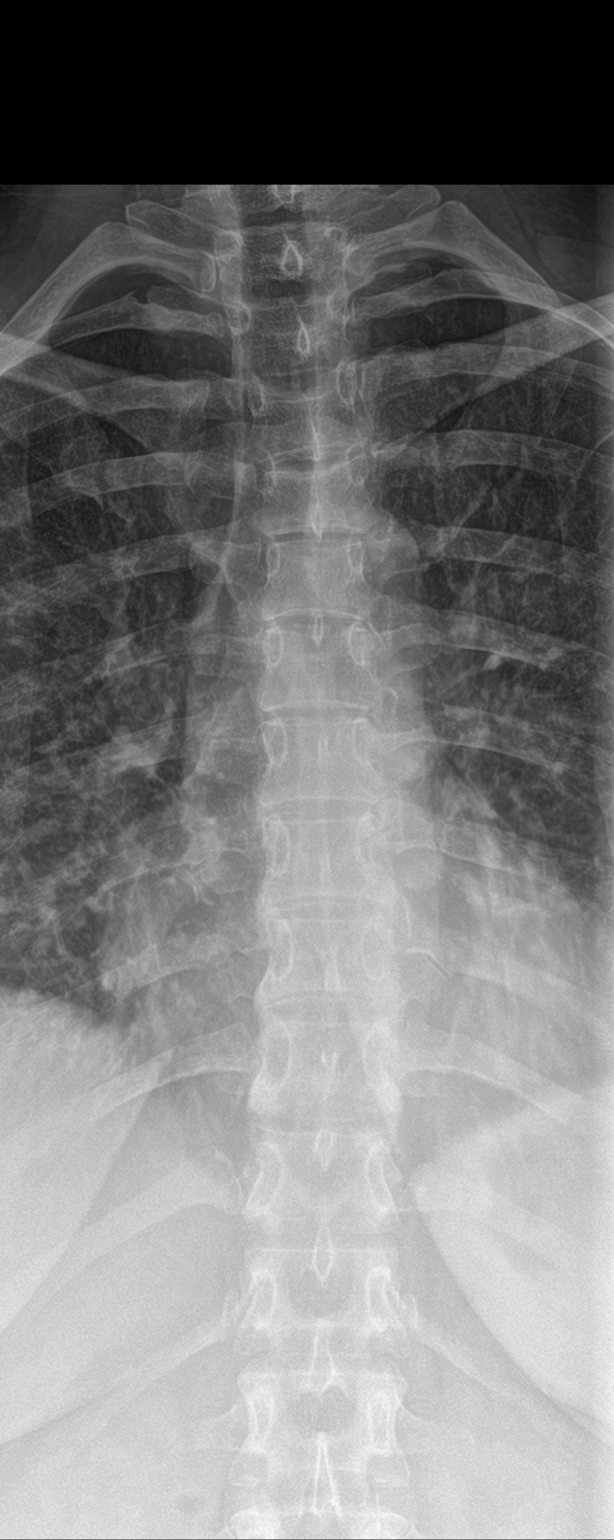
[im 2/3]
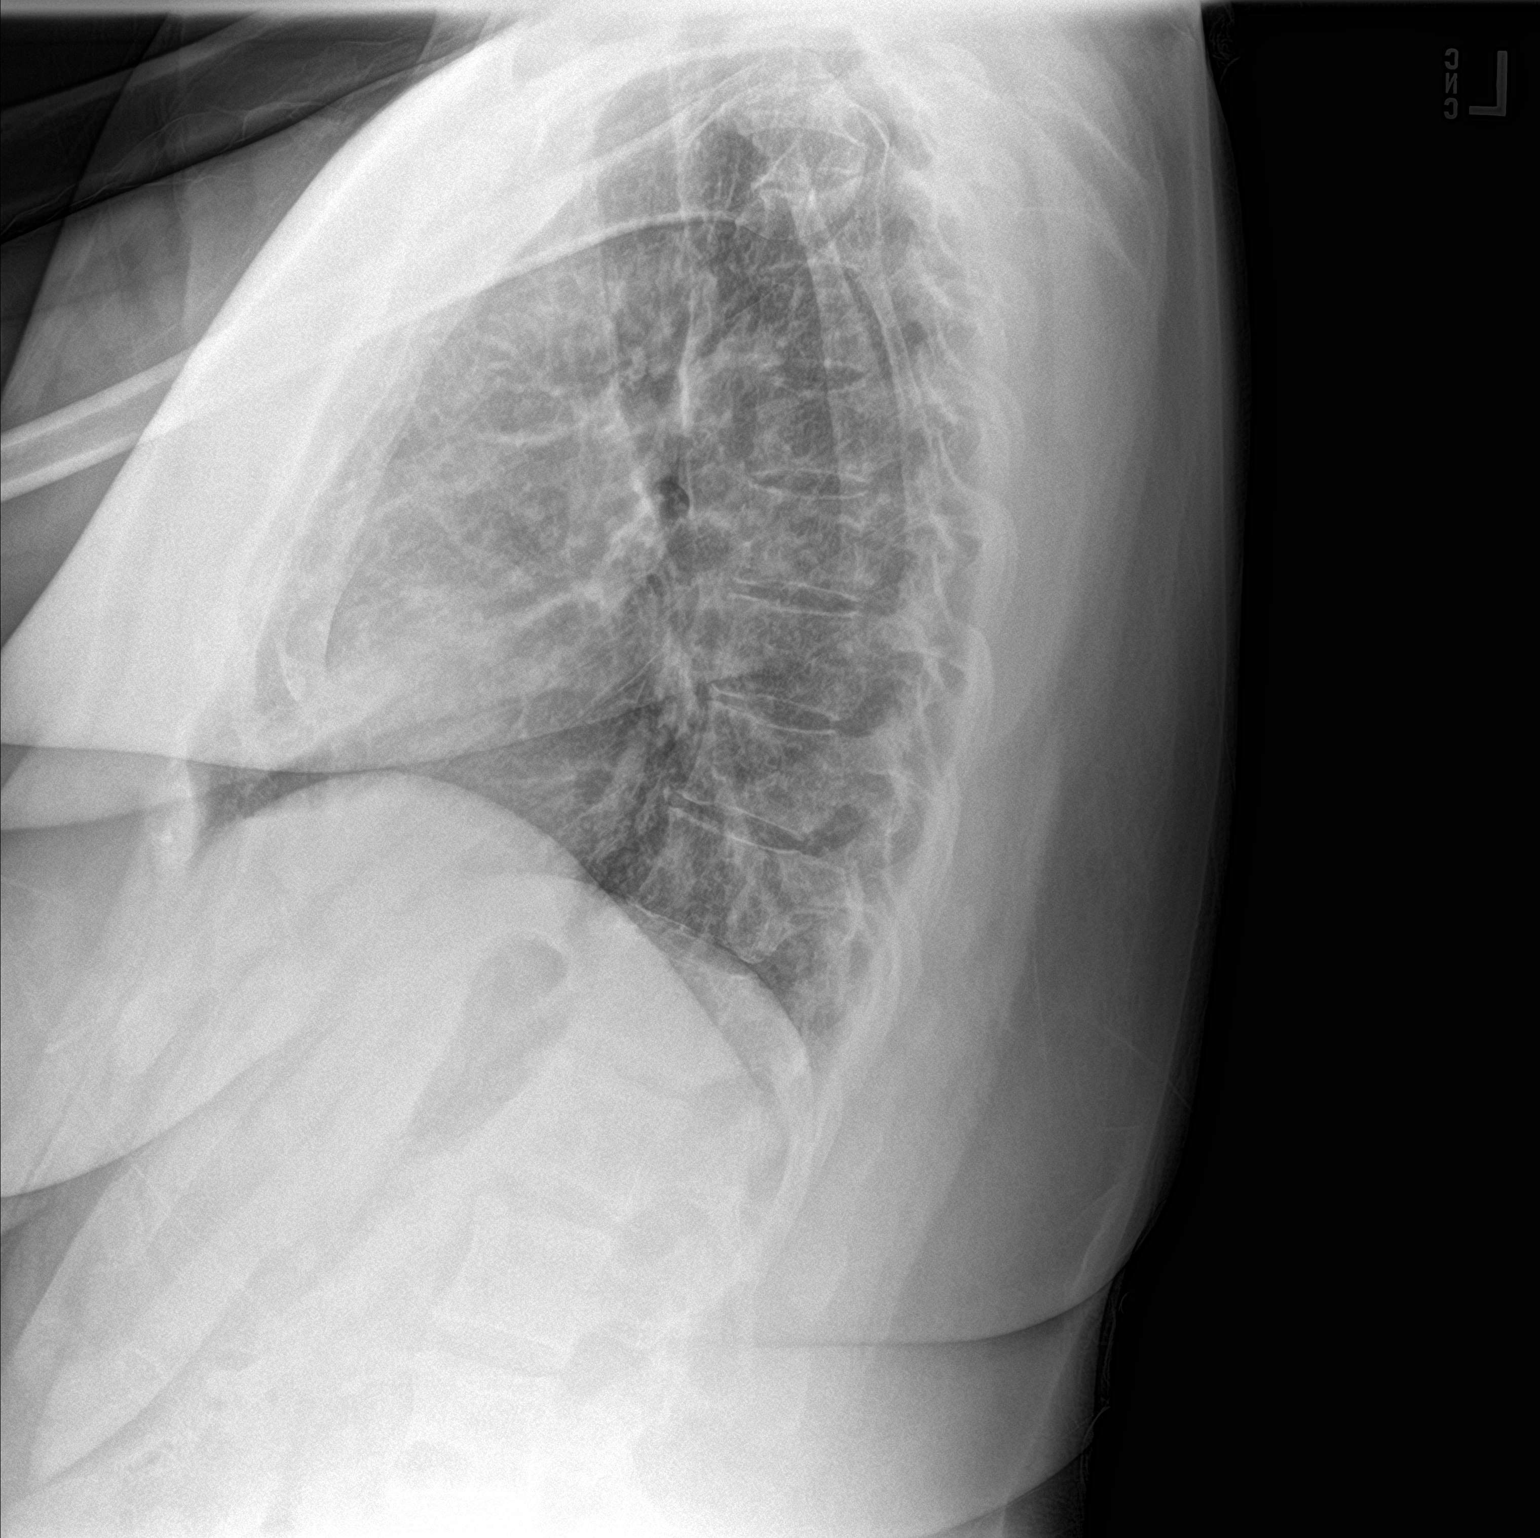
[im 3/3]
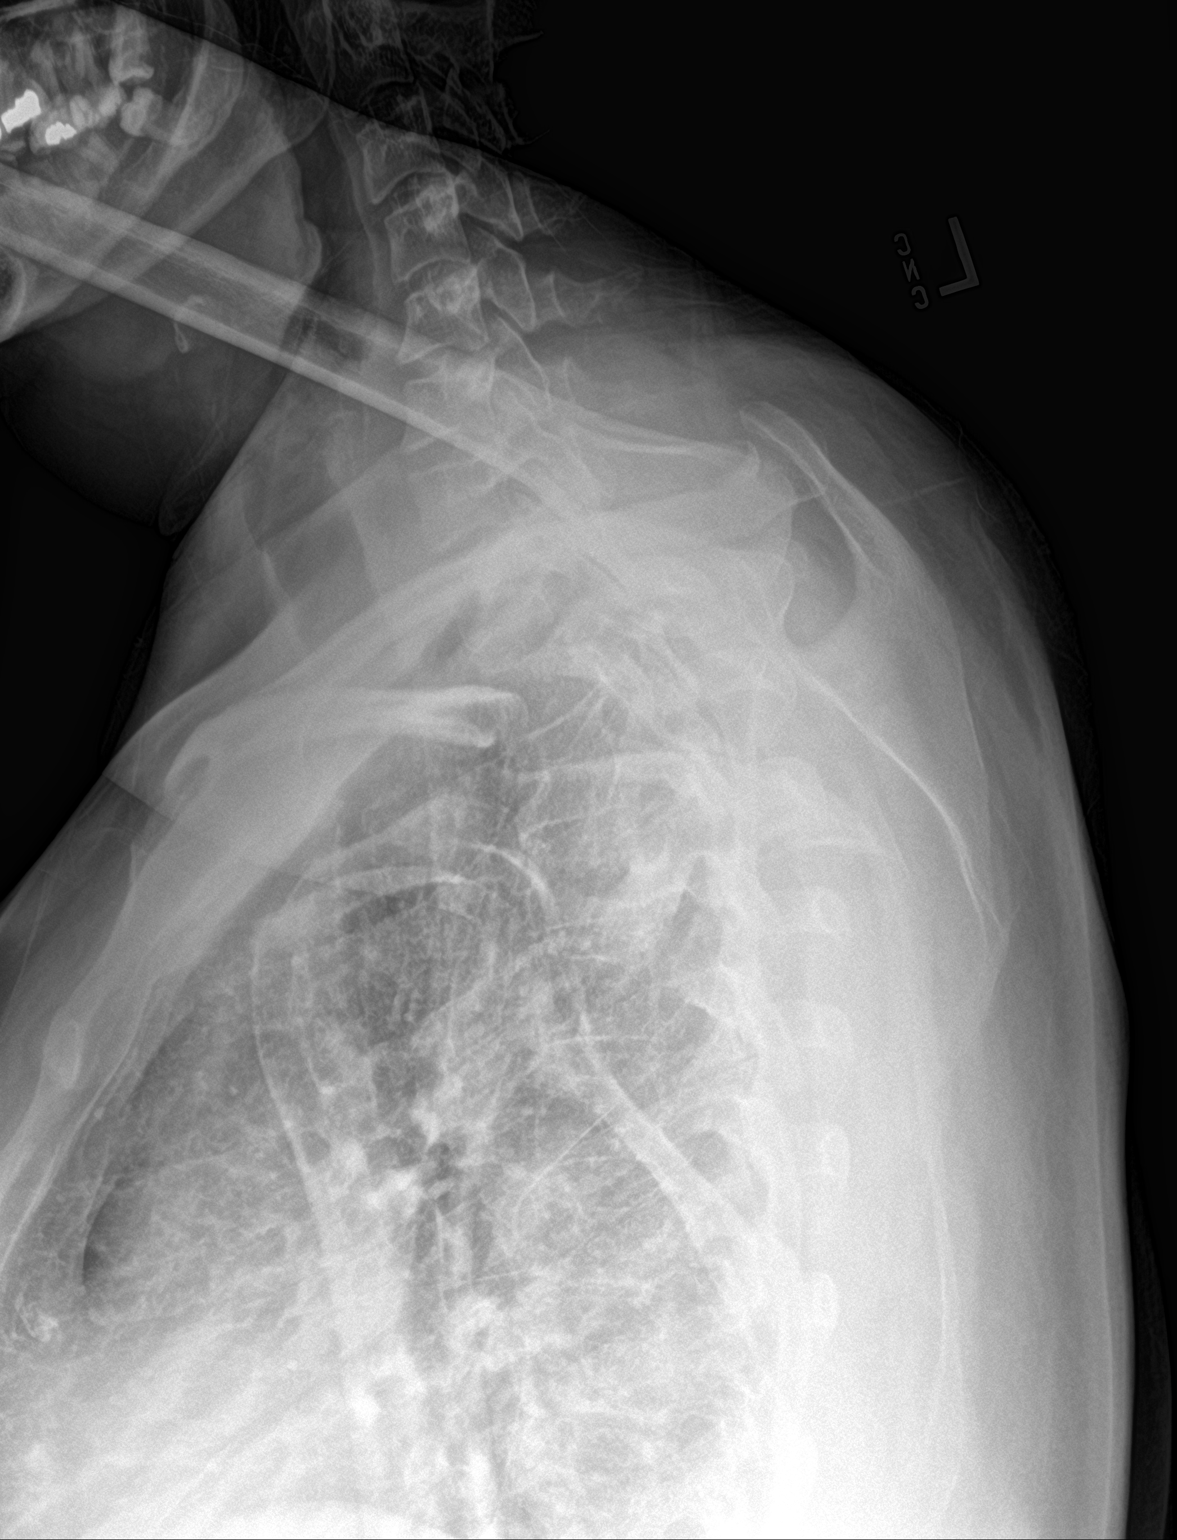

[3 of 3 positions shown; findings below may reference images not displayed]

FINDINGS: There is no evidence of thoracic spine fracture. Alignment is
normal. No other significant bone abnormalities are identified.
IMPRESSION: Negative.

## 2019-06-22 ENCOUNTER — Other Ambulatory Visit: Payer: Self-pay

## 2019-06-22 ENCOUNTER — Ambulatory Visit (INDEPENDENT_AMBULATORY_CARE_PROVIDER_SITE_OTHER): Payer: BLUE CROSS/BLUE SHIELD | Admitting: Psychiatry

## 2019-06-22 DIAGNOSIS — Z5329 Procedure and treatment not carried out because of patient's decision for other reasons: Secondary | ICD-10-CM

## 2019-06-22 NOTE — Progress Notes (Signed)
No response to call 

## 2019-07-16 ENCOUNTER — Ambulatory Visit
Admission: EM | Admit: 2019-07-16 | Discharge: 2019-07-16 | Disposition: A | Discharge status: Home / Self Care | Payer: BLUE CROSS/BLUE SHIELD | Attending: Internal Medicine | Admitting: Internal Medicine

## 2019-07-16 ENCOUNTER — Other Ambulatory Visit: Payer: Self-pay

## 2019-07-16 ENCOUNTER — Ambulatory Visit (INDEPENDENT_AMBULATORY_CARE_PROVIDER_SITE_OTHER): Payer: BLUE CROSS/BLUE SHIELD

## 2019-07-16 ENCOUNTER — Encounter: Payer: Self-pay | Admitting: Emergency Medicine

## 2019-07-16 DIAGNOSIS — K5901 Slow transit constipation: Secondary | ICD-10-CM | POA: Diagnosis not present

## 2019-07-16 DIAGNOSIS — R1010 Upper abdominal pain, unspecified: Secondary | ICD-10-CM | POA: Insufficient documentation

## 2019-07-16 NOTE — Discharge Instructions (Addendum)
Try ginger capsules 300 mg up to three times a day to help with digestion and peristalsis. Also drink prune juice 4 oz every morning and see if this helps move the rest of the stool in the R colon.   Call your hepatologist to report your symptoms

## 2019-07-16 NOTE — ED Triage Notes (Signed)
Patient started a new medication for Hepatitis C and started having mid abdominal pain and bloating since Wed.  Patient states that she took Miralax on Wed and had 2 episodes of diarrhea.  Patient denies vomiting.

## 2019-07-16 NOTE — ED Provider Notes (Addendum)
[facility]    CSN: [id number] Arrival date & time: 07/16/19  1226      History   Chief Complaint Chief Complaint  Patient presents with  . Abdominal Pain    HPI Jasmine [name] is a 38 y.o. female. who presents with abdominal pain after taking her Hep C med on day 14th and today is day 18th. Pain comes in waves and is located on upper abdomen and is described as cramping and intermittent and lasts about 5 seconds and comes every 1-2  minutes. Provoked with laying down or straightens out. Pain started 3 days ago. The day this started she took miralax thinking it was from constipation and since then had 2 loose stools. She tried to vomit and nothing came back. Having BM's does not helps. Able to sleep fine.Once she wakes up the pain comes back. Her  CBC and CMP from 9/29 were normal. Denies bloating or nausea. Has been burping a lot since the pain started. She still has her GB.  2- would like her throat looked at since it has felt a little scratchy the past 2 days   Past Medical History:  Diagnosis Date  . Hep C w/o coma, chronic (HCC)   . Substance abuse [facility](HCC)     Patient Active Problem List   Diagnosis Date Noted  . Hepatitis C 07/28/2018  . Seizures (HCC) 11/23/2017  . Trichomonas vaginitis 12/31/2015  . Accidental paracetamol poisoning 12/30/2015  . Chronic bilateral low back pain without sciatica 12/30/2015  . Dental infection 12/30/2015  . Tobacco abuse disorder 12/30/2015  . Transaminitis 12/30/2015  . Heroin use disorder, moderate, dependence (HCC) 02/24/2015  . Opioid dependence with intoxication delirium (HCC)   . Polysubstance abuse [facility](HCC)     Past Surgical History:  Procedure Laterality Date  . KNEE ARTHROCENTESIS Left   . PATELLECTOMY    . TUBAL LIGATION N/A   . TUBAL LIGATION      OB History   No obstetric history on file.      Home Medications    Prior to Admission medications   Medication Sig Start Date End Date Taking? Authorizing  Provider  Cholecalciferol (VITAMIN D3) 250 MCG (10000 UT) TABS Take by mouth.   Yes [provider]  cyclobenzaprine (FLEXERIL) 10 MG tablet Take 1 tablet (10 mg total) by mouth at bedtime. 12/20/18  Yes Payton Mccallumonty, [place], MD  Ledipasvir-Sofosbuvir 90-400 MG TABS Take by mouth. 06/06/19  Yes [provider]  Magnesium Carbonate (MAGNESIUM GLUCONATE) 54mg /705ml syringe Take by mouth.   Yes [provider]  venlafaxine XR (EFFEXOR XR) 75 MG 24 hr capsule Take 1 capsule (75 mg total) by mouth daily with breakfast. 03/17/19  Yes Eappen, Saramma, MD  hydrOXYzine (VISTARIL) 25 MG capsule Take 1-2 capsules (25-50 mg total) by mouth at bedtime as needed. FOR SLEEP 03/17/19   Jomarie LongsEappen, Saramma, MD  ibuprofen (ADVIL,MOTRIN) 800 MG tablet Take 1 tablet (800 mg total) by mouth every 8 (eight) hours as needed for moderate pain. 06/04/15   Joni ReiningSmith, Ronald K, PA-C  naloxone [facility](NARCAN) 0.4 MG/ML injection Inject 1 mL (0.4 mg total) into the skin as needed. 05/30/15   Clapacs, Jackquline DenmarkJohn T, MD  traZODone (DESYREL) 50 MG tablet TAKE ONE AT BEDTIME NIGHTLY AS NEEDED FOR SLEEP 03/17/19 07/16/19  Jomarie LongsEappen, Saramma, MD    Family History Family History  Problem Relation Age of Onset  . Depression Mother   . Depression Brother     Social History Social History  Tobacco Use  . Smoking status: Current Every Day Smoker    Packs/day: 1.00    Types: Cigarettes  . Smokeless tobacco: Never Used  Substance Use Topics  . Alcohol use: Never    Frequency: Never  . Drug use: Not Currently    Types: Heroin, IV    Comment: heroin     Allergies   Hydrocodone-acetaminophen   Review of Systems Review of Systems  Constitutional: Negative for appetite change, chills, diaphoresis and fever.  HENT: Positive for sore throat. Negative for congestion, ear pain, postnasal drip, rhinorrhea and trouble swallowing.   Eyes: Negative for discharge.  Respiratory: Negative for cough.   Gastrointestinal: Positive for abdominal  pain and constipation. Negative for abdominal distention, blood in stool, nausea and vomiting.  Genitourinary: Negative for difficulty urinating and flank pain.  Musculoskeletal: Negative for back pain.  Skin: Negative for rash.     Physical Exam Triage Vital Signs ED Triage Vitals  Enc Vitals Group     BP 07/16/19 1303 105/79     Pulse Rate 07/16/19 1303 69     Resp 07/16/19 1303 16     Temp 07/16/19 1303 98.3 F (36.8 C)     Temp Source 07/16/19 1303 Oral     SpO2 07/16/19 1303 100 %     Weight 07/16/19 1258 215 lb (97.5 kg)     Height 07/16/19 1258 5\' 10"  (1.778 m)     Head Circumference --      Peak Flow --      Pain Score 07/16/19 1258 4     Pain Loc --      Pain Edu? --      Excl. in GC? --    No data found.  Updated Vital Signs BP 105/79 (BP Location: Right Arm)   Pulse 69   Temp 98.3 F (36.8 C) (Oral)   Resp 16   Ht 5\' 10"  (1.778 m)   Wt 215 lb (97.5 kg)   LMP 07/09/2019 (Approximate)   SpO2 100%   BMI 30.85 kg/m   Visual Acuity Right Eye Distance:   Left Eye Distance:   Bilateral Distance:    Right Eye Near:   Left Eye Near:    Bilateral Near:     Physical Exam Vitals signs and nursing note reviewed.  Constitutional:      General: She is not in acute distress.    Appearance: She is obese. She is not toxic-appearing.  HENT:     Mouth/Throat:     Mouth: Mucous membranes are moist.     Pharynx: Oropharynx is clear. No pharyngeal swelling or oropharyngeal exudate.  Cardiovascular:     Rate and Rhythm: Normal rate and regular rhythm.     Heart sounds: No murmur.  Pulmonary:     Effort: Pulmonary effort is normal.     Breath sounds: Normal breath sounds.  Abdominal:     General: Bowel sounds are normal. There is no distension.     Palpations: Abdomen is soft. There is no hepatomegaly, splenomegaly, mass or pulsatile mass.     Tenderness: There is no guarding or rebound.     Hernia: No hernia is present.  Skin:    General: Skin is dry.   Neurological:     Mental Status: She is alert and oriented to person, place, and time.  Psychiatric:        Mood and Affect: Mood normal.        Behavior: Behavior normal.  UC Treatments / Results  Labs (all labs ordered are listed, but only abnormal results are displayed) Labs Reviewed - No data to display  EKG Sinus Bradycardia, otherwise normal.   Radiology KUB- increased stool noted on ascending colon.   Procedures Procedures  Medications Ordered in UC Medications - No data to display  Initial Impression / Assessment and Plan / UC Course  I have reviewed the triage vital signs and the nursing notes.  Pertinent  imaging results that were available during my care of the patient were reviewed by me and considered in my medical decision making (see chart for details).  Her KUB shows large stool amount on ascending and part of R transverse colon. Could be this is trying to move and could be the waves she feels. I advised her to try Ginger capsules which helps with peristalsis and may take it up to tid. Also to call her hepatologist and check if there are pts who have her current symptoms.  Final Clinical Impressions(s) / UC Diagnoses   Final diagnoses:  Pain of upper abdomen  Slow transit constipation     Discharge Instructions     Try ginger capsules 300 mg up to three times a day to help with digestion and peristalsis. Also drink prune juice 4 oz every morning and see if this helps move the rest of the stool in the R colon.   Call your hepatologist to report your symptoms     ED Prescriptions    None     PDMP not reviewed this encounter.   Shelby Mattocks, PA-C 07/16/19 [place], Sunday Spillers, PA-C 07/27/19 1526

## 2019-07-28 ENCOUNTER — Other Ambulatory Visit: Payer: Self-pay | Admitting: Family

## 2019-07-28 DIAGNOSIS — Z1231 Encounter for screening mammogram for malignant neoplasm of breast: Secondary | ICD-10-CM

## 2019-08-26 ENCOUNTER — Ambulatory Visit
Admission: RE | Admit: 2019-08-26 | Discharge: 2019-08-26 | Disposition: A | Discharge status: Home / Self Care | Payer: BLUE CROSS/BLUE SHIELD | Source: Ambulatory Visit | Attending: Family | Admitting: Family

## 2019-08-26 DIAGNOSIS — Z1231 Encounter for screening mammogram for malignant neoplasm of breast: Secondary | ICD-10-CM | POA: Diagnosis not present
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
## 2020-05-06 IMAGING — CR DG ABDOMEN 1V
2 series · 2 of 2 positions shown · non-contrast
Comparison: None.

CLINICAL DATA: Upper abdominal cramping x 3-4 days

EXAM:
ABDOMEN - 1 VIEW

[abdomen kub (1 of 2)]
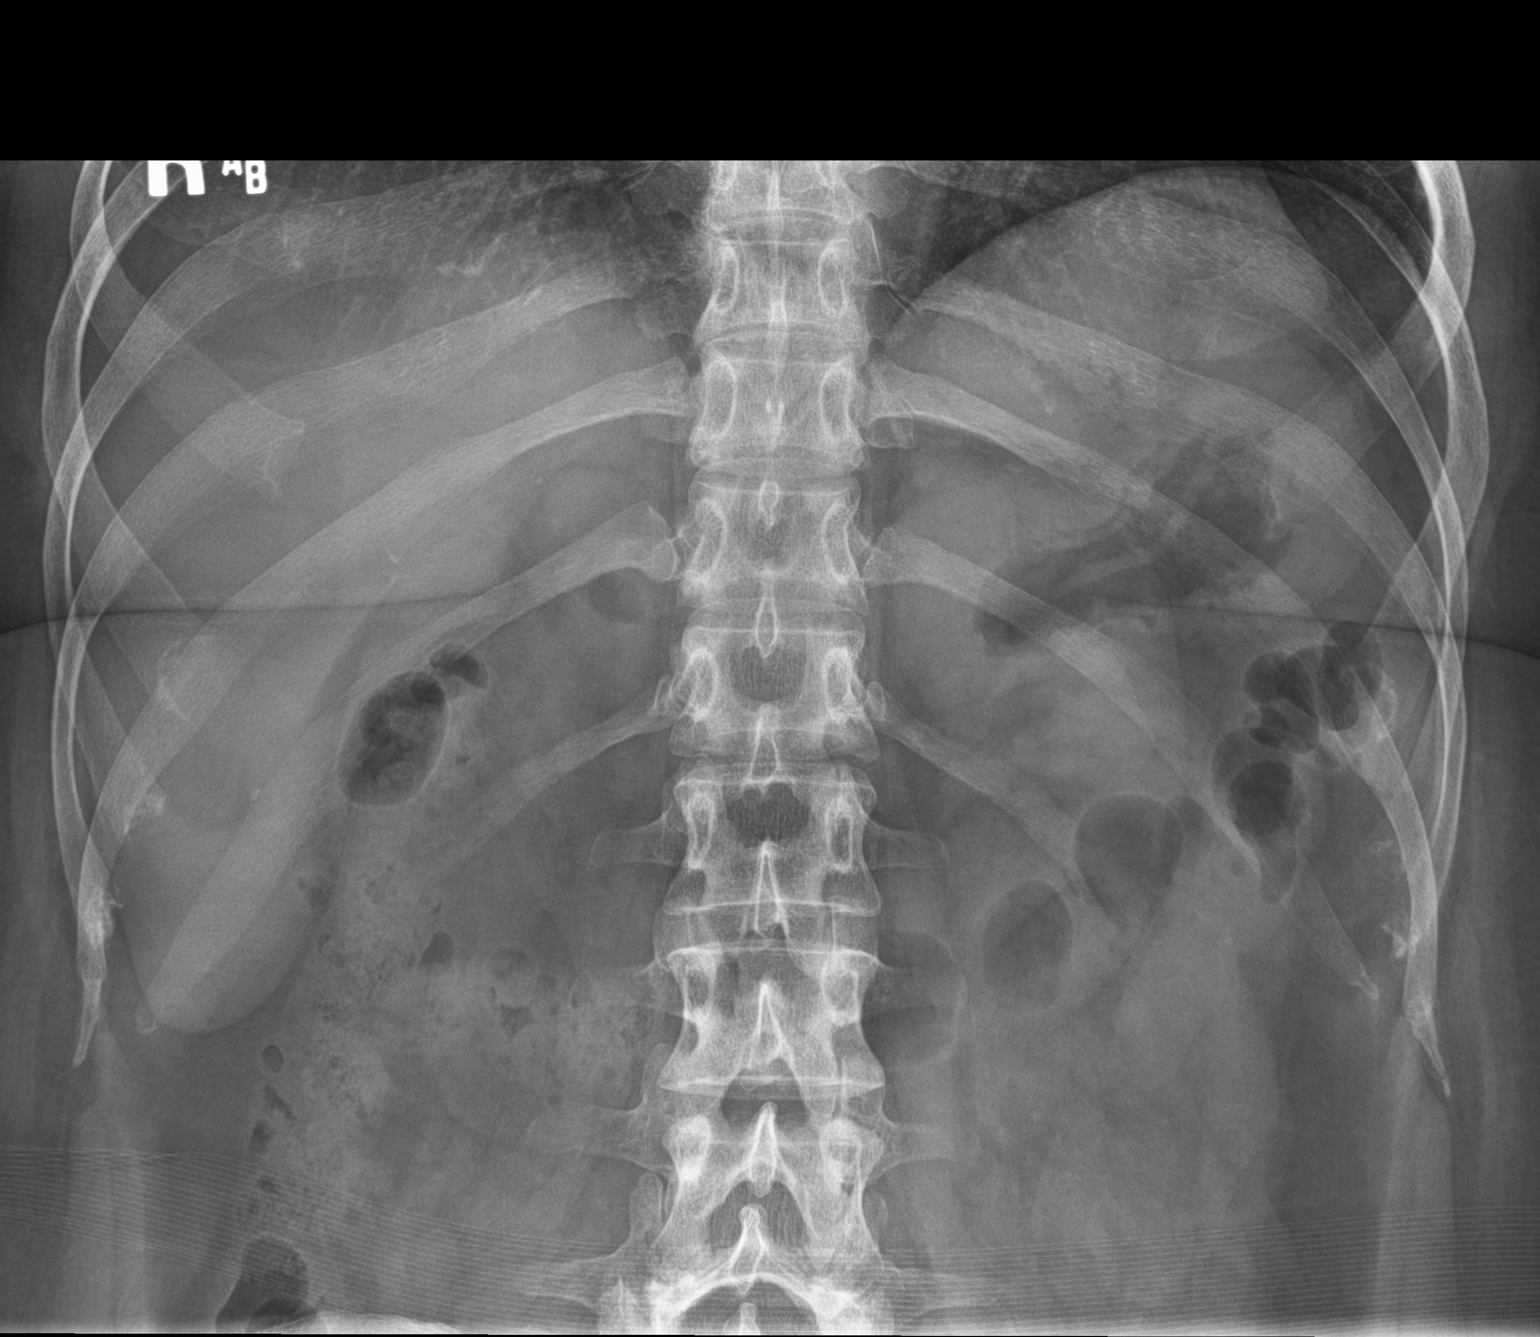

[abdomen kub (2 of 2)]
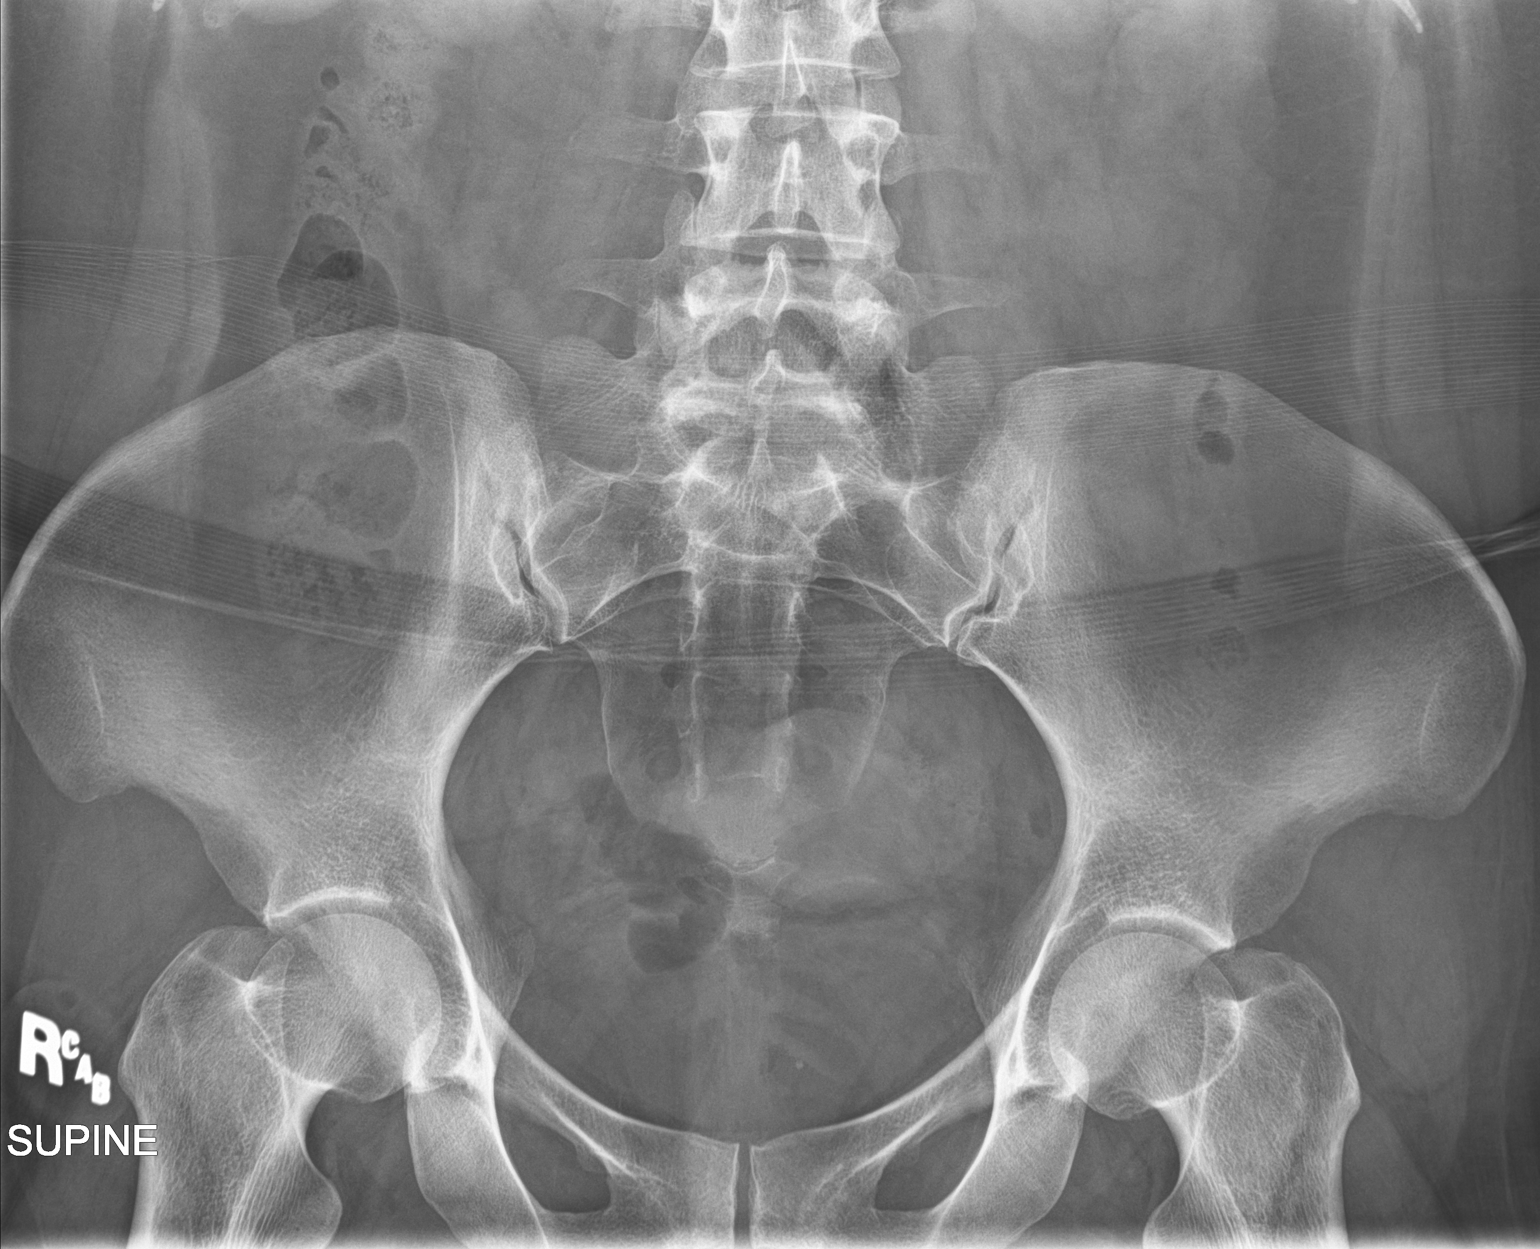

[2 of 2 positions shown; findings below may reference images not displayed]

FINDINGS: Nonobstructive bowel gas pattern.

Visualized osseous structures are within normal limits.

Lung bases are clear.
IMPRESSION: Negative.

## 2021-01-31 ENCOUNTER — Encounter: Payer: Self-pay | Admitting: Emergency Medicine

## 2021-01-31 ENCOUNTER — Ambulatory Visit
Admission: EM | Admit: 2021-01-31 | Discharge: 2021-01-31 | Disposition: A | Discharge status: Home / Self Care | Payer: BLUE CROSS/BLUE SHIELD | Attending: Physician Assistant | Admitting: Physician Assistant

## 2021-01-31 ENCOUNTER — Other Ambulatory Visit: Payer: Self-pay

## 2021-01-31 DIAGNOSIS — J029 Acute pharyngitis, unspecified: Secondary | ICD-10-CM | POA: Insufficient documentation

## 2021-01-31 DIAGNOSIS — Z79899 Other long term (current) drug therapy: Secondary | ICD-10-CM | POA: Diagnosis not present

## 2021-01-31 DIAGNOSIS — B349 Viral infection, unspecified: Secondary | ICD-10-CM

## 2021-01-31 DIAGNOSIS — R519 Headache, unspecified: Secondary | ICD-10-CM | POA: Diagnosis not present

## 2021-01-31 DIAGNOSIS — R5383 Other fatigue: Secondary | ICD-10-CM | POA: Diagnosis not present

## 2021-01-31 DIAGNOSIS — R0981 Nasal congestion: Secondary | ICD-10-CM | POA: Insufficient documentation

## 2021-01-31 DIAGNOSIS — Z20822 Contact with and (suspected) exposure to covid-19: Secondary | ICD-10-CM | POA: Insufficient documentation

## 2021-01-31 DIAGNOSIS — R197 Diarrhea, unspecified: Secondary | ICD-10-CM | POA: Insufficient documentation

## 2021-01-31 DIAGNOSIS — B1920 Unspecified viral hepatitis C without hepatic coma: Secondary | ICD-10-CM | POA: Diagnosis not present

## 2021-01-31 DIAGNOSIS — F17210 Nicotine dependence, cigarettes, uncomplicated: Secondary | ICD-10-CM | POA: Diagnosis not present

## 2021-01-31 DIAGNOSIS — R059 Cough, unspecified: Secondary | ICD-10-CM | POA: Insufficient documentation

## 2021-01-31 DIAGNOSIS — Z885 Allergy status to narcotic agent status: Secondary | ICD-10-CM | POA: Diagnosis not present

## 2021-01-31 HISTORY — DX: Premenstrual dysphoric disorder: F32.81

## 2021-01-31 LAB — GROUP A STREP BY PCR: Group A Strep by PCR: NOT DETECTED

## 2021-01-31 MED ORDER — IPRATROPIUM BROMIDE 0.06 % NA SOLN: 2.0000 | Freq: Four times a day (QID) | NASAL | 12 refills | Status: DC

## 2021-01-31 MED ORDER — PROMETHAZINE-DM 6.25-15 MG/5ML PO SYRP: 5.0000 mL | ORAL_SOLUTION | Freq: Four times a day (QID) | ORAL | 0 refills | Status: DC | PRN

## 2021-01-31 NOTE — ED Provider Notes (Signed)
[facility]    CSN: [id number] Arrival date & time: 01/31/21  1914      History   Chief Complaint Chief Complaint  Patient presents with  . Nasal Congestion  . Cough  . Sore Throat  . Diarrhea    HPI Jasmine [name] is a 40 y.o. female presenting for 3-day history of nasal congestion, cough, sore throat and diarrhea.  Patient states that she feels very fatigued and has had some headaches.  Denies any body aches.  No sinus pain, ear pain, chest pain, breathing difficulty, nausea or vomiting.  Patient says some of her family members have been sick but she attributed that to allergies.  She denies any fevers but admits she does not have a thermometer.  No chills or sweats.  No known exposure to COVID-19 or influenza.  No strep exposure either.  Patient states that she is very uncomfortable and cannot sleep at night due to the cough.  She states that she is taken multiple over-the-counter cough medications as well as Claritin, Tylenol and used Afrin, nasal saline and Vicks VapoRub.  Past medical history is significant for hepatitis C, seizures and previous substance abuse.  Patient is a current smoker.  Patient has no other complaints or concerns.  HPI  Past Medical History:  Diagnosis Date  . Hep C w/o coma, chronic (HCC)   . PMDD (premenstrual dysphoric disorder)   . Substance abuse [facility])     Patient Active Problem List   Diagnosis Date Noted  . Hepatitis C 07/28/2018  . Seizures (HCC) 11/23/2017  . Trichomonas vaginitis 12/31/2015  . Accidental paracetamol poisoning 12/30/2015  . Chronic bilateral low back pain without sciatica 12/30/2015  . Dental infection 12/30/2015  . Tobacco abuse disorder 12/30/2015  . Transaminitis 12/30/2015  . Heroin use disorder, moderate, dependence (HCC) 02/24/2015  . Opioid dependence with intoxication delirium (HCC)   . Polysubstance abuse [facility])     Past Surgical History:  Procedure Laterality Date  . KNEE ARTHROCENTESIS Left    . PATELLECTOMY    . TUBAL LIGATION N/A   . TUBAL LIGATION      OB History   No obstetric history on file.      Home Medications    Prior to Admission medications   Medication Sig Start Date End Date Taking? Authorizing Provider  ipratropium (ATROVENT) 0.06 % nasal spray Place 2 sprays into both nostrils 4 (four) times daily. 01/31/21  Yes Shirlee Latch, PA-C  promethazine-dextromethorphan (PROMETHAZINE-DM) 6.25-15 MG/5ML syrup Take 5 mLs by mouth 4 (four) times daily as needed for cough. 01/31/21  Yes Eusebio Friendly B, PA-C  venlafaxine XR (EFFEXOR-XR) 150 MG 24 hr capsule Take 150 mg by mouth daily. 10/17/20  Yes [provider]  Cholecalciferol (VITAMIN D3) 250 MCG (10000 UT) TABS Take by mouth.    [provider]  cyclobenzaprine (FLEXERIL) 10 MG tablet Take 1 tablet (10 mg total) by mouth at bedtime. 12/20/18   Payton Mccallum, MD  hydrOXYzine (VISTARIL) 25 MG capsule Take 1-2 capsules (25-50 mg total) by mouth at bedtime as needed. FOR SLEEP 03/17/19   Jomarie Longs, MD  ibuprofen (ADVIL,MOTRIN) 800 MG tablet Take 1 tablet (800 mg total) by mouth every 8 (eight) hours as needed for moderate pain. 06/04/15   Joni Reining, PA-C  Ledipasvir-Sofosbuvir 90-400 MG TABS Take by mouth. 06/06/19   [provider]  Magnesium Carbonate (MAGNESIUM GLUCONATE) /9ml syringe Take by mouth.    [provider]  naloxone (  NARCAN) 0.4 MG/ML injection Inject 1 mL (0.4 mg total) into the skin as needed. 05/30/15   Clapacs, Jackquline Denmark, MD  traZODone (DESYREL) 50 MG tablet TAKE ONE AT BEDTIME NIGHTLY AS NEEDED FOR SLEEP 03/17/19 07/16/19  Jomarie Longs, MD    Family History Family History  Problem Relation Age of Onset  . Depression Mother   . Other Mother        covid  . Hyperlipidemia Mother   . COPD Mother   . Heart attack Mother   . Crohn's disease Father   . COPD Father   . Breast cancer Sister 78  . Depression Brother     Social History Social History    Tobacco Use  . Smoking status: Current Every Day Smoker    Packs/day: 1.00    Types: Cigarettes  . Smokeless tobacco: Never Used  Vaping Use  . Vaping Use: Never used  Substance Use Topics  . Alcohol use: Never  . Drug use: Not Currently    Types: Heroin, IV    Comment: heroin last use 11/16/17     Allergies   Hydrocodone-acetaminophen   Review of Systems Review of Systems  Constitutional: Positive for fatigue. Negative for chills, diaphoresis and fever.  HENT: Positive for congestion, rhinorrhea and sore throat. Negative for ear pain, sinus pressure and sinus pain.   Respiratory: Positive for cough. Negative for shortness of breath.   Gastrointestinal: Positive for diarrhea. Negative for abdominal pain, nausea and vomiting.  Musculoskeletal: Negative for arthralgias and myalgias.  Skin: Negative for rash.  Neurological: Positive for headaches. Negative for weakness.  Hematological: Negative for adenopathy.     Physical Exam Triage Vital Signs ED Triage Vitals  Enc Vitals Group     BP 01/31/21 0947 110/80     Pulse Rate 01/31/21 0947 80     Resp 01/31/21 0947 18     Temp 01/31/21 0947 98.2 F (36.8 C)     Temp Source 01/31/21 0947 Oral     SpO2 01/31/21 0947 100 %     Weight 01/31/21 0947 230 lb (104.3 kg)     Height 01/31/21 0947 5\' 10"  (1.778 m)     Head Circumference --      Peak Flow --      Pain Score 01/31/21 0946 7     Pain Loc --      Pain Edu? --      Excl. in GC? --    No data found.  Updated Vital Signs BP 110/80 (BP Location: Left Arm)   Pulse 80   Temp 98.2 F (36.8 C) (Oral)   Resp 18   Ht 5\' 10"  (1.778 m)   Wt 230 lb (104.3 kg)   LMP 01/28/2021 (Exact Date)   SpO2 100%   BMI 33.00 kg/m       Physical Exam Vitals and nursing note reviewed.  Constitutional:      General: She is not in acute distress.    Appearance: Normal appearance. She is ill-appearing. She is not toxic-appearing.  HENT:     Head: Normocephalic and  atraumatic.     Right Ear: Tympanic membrane, ear canal and external ear normal.     Left Ear: Tympanic membrane, ear canal and external ear normal.     Nose: Congestion and rhinorrhea present.     Mouth/Throat:     Mouth: Mucous membranes are dry.     Pharynx: Oropharynx is clear. Posterior oropharyngeal erythema present.  Eyes:  General: No scleral icterus.       Right eye: No discharge.        Left eye: No discharge.     Conjunctiva/sclera: Conjunctivae normal.  Cardiovascular:     Rate and Rhythm: Normal rate and regular rhythm.     Heart sounds: Normal heart sounds.  Pulmonary:     Effort: Pulmonary effort is normal. No respiratory distress.     Breath sounds: Normal breath sounds.  Musculoskeletal:     Cervical back: Neck supple.  Skin:    General: Skin is dry.  Neurological:     General: No focal deficit present.     Mental Status: She is alert. Mental status is at baseline.     Motor: No weakness.     Gait: Gait normal.  Psychiatric:        Mood and Affect: Mood normal.        Behavior: Behavior normal.        Thought Content: Thought content normal.      UC Treatments / Results  Labs (all labs ordered are listed, but only abnormal results are displayed) Labs Reviewed  GROUP A STREP BY PCR  SARS CORONAVIRUS 2 (TAT 6-24 HRS)    EKG   Radiology No results found.  Procedures Procedures (including critical care time)  Medications Ordered in UC Medications - No data to display  Initial Impression / Assessment and Plan / UC Course  I have reviewed the triage vital signs and the nursing notes.  Pertinent labs & imaging results that were available during my care of the patient were reviewed by me and considered in my medical decision making (see chart for details).   40 year old female presenting for 3-day history of cough, congestion, fatigue, and sore throat.  Vital signs are normal and stable.  She is ill-appearing, but nontoxic.  Her tongue is dry.   She does state that she has been trying to increase her fluid intake though.  Mild posterior pharyngeal erythema and nasal congestion.  Her chest is clear to auscultation heart regular rate and rhythm.  Molecular strep test is negative.  COVID test performed.  Current CDC guidelines, isolation protocol and ED precautions reviewed with patient.  Advised her this is likely a viral illness and encouraged increasing rest and fluids.  I did send Promethazine DM and Atrovent nasal spray.  Advised her to continue with Tylenol and ibuprofen as needed for headaches.  Advised her to follow-up with her clinic as needed for any worsening symptoms.  If COVID test is positive she would be a good candidate for antibody therapy since she does have chronic hepatitis C and is high risk. Also unvaccinated for COVID.  Advised her someone will be in contact with her if she is positive.  Work note given.   Final Clinical Impressions(s) / UC Diagnoses   Final diagnoses:  Viral illness  Nasal congestion  Sore throat  Cough  Diarrhea, unspecified type     Discharge Instructions     URI/COLD SYMPTOMS: Your exam today is consistent with a viral illness. Antibiotics are not indicated at this time. Use medications as directed, including cough syrup, nasal saline, and decongestants. Your symptoms should improve over the next few days and resolve within 7-10 days. Increase rest and fluids. F/u if symptoms worsen or predominate such as sore throat, ear pain, productive cough, shortness of breath, or if you develop high fevers or worsening fatigue over the next several days.    You have  received COVID testing today either for positive exposure, concerning symptoms that could be related to COVID infection, screening purposes, or re-testing after confirmed positive.  Your test obtained today checks for active viral infection in the last 1-2 weeks. If your test is negative now, you can still test positive later. So, if you  do develop symptoms you should either get re-tested and/or isolate x 5 days and then strict mask use x 5 days (unvaccinated) or mask use x 10 days (vaccinated). Please follow CDC guidelines.  While Rapid antigen tests come back in 15-20 minutes, send out PCR/molecular test results typically come back within 1-3 days. In the mean time, if you are symptomatic, assume this could be a positive test and treat/monitor yourself as if you do have COVID.   We will call with test results if positive. Please download the MyChart app and set up a profile to access test results.   If symptomatic, go home and rest. Push fluids. Take Tylenol as needed for discomfort. Gargle warm salt water. Throat lozenges. Take Mucinex DM or Robitussin for cough. Humidifier in bedroom to ease coughing. Warm showers. Also review the COVID handout for more information.  COVID-19 INFECTION: The incubation period of COVID-19 is approximately 14 days after exposure, with most symptoms developing in roughly 4-5 days. Symptoms may range in severity from mild to critically severe. Roughly 80% of those infected will have mild symptoms. People of any age may become infected with COVID-19 and have the ability to transmit the virus. The most common symptoms include: fever, fatigue, cough, body aches, headaches, sore throat, nasal congestion, shortness of breath, nausea, vomiting, diarrhea, changes in smell and/or taste.    COURSE OF ILLNESS Some patients may begin with mild disease which can progress quickly into critical symptoms. If your symptoms are worsening please call ahead to the Emergency Department and proceed there for further treatment. Recovery time appears to be roughly 1-2 weeks for mild symptoms and 3-6 weeks for severe disease.   GO IMMEDIATELY TO ER FOR FEVER YOU ARE UNABLE TO GET DOWN WITH TYLENOL, BREATHING PROBLEMS, CHEST PAIN, FATIGUE, LETHARGY, INABILITY TO EAT OR DRINK, ETC  QUARANTINE AND ISOLATION: To help decrease the  spread of COVID-19 please remain isolated if you have COVID infection or are highly suspected to have COVID infection. This means -stay home and isolate to one room in the home if you live with others. Do not share a bed or bathroom with others while ill, sanitize and wipe down all countertops and keep common areas clean and disinfected. Stay home for 5 days. If you have no symptoms or your symptoms are resolving after 5 days, you can leave your house. Continue to wear a mask around others for 5 additional days. If you have been in close contact (within 6 feet) of someone diagnosed with COVID 19, you are advised to quarantine in your home for 14 days as symptoms can develop anywhere from 2-14 days after exposure to the virus. If you develop symptoms, you  must isolate.  Most current guidelines for COVID after exposure -unvaccinated: isolate 5 days and strict mask use x 5 days. Test on day 5 is possible -vaccinated: wear mask x 10 days if symptoms do not develop -You do not necessarily need to be tested for COVID if you have + exposure and  develop symptoms. Just isolate at home x10 days from symptom onset During this global pandemic, CDC advises to practice social distancing, try to stay at least 50ft  away from others at all times. Wear a face covering. Wash and sanitize your hands regularly and avoid going anywhere that is not necessary.  KEEP IN MIND THAT THE COVID TEST IS NOT 100% ACCURATE AND YOU SHOULD STILL DO EVERYTHING TO PREVENT POTENTIAL SPREAD OF VIRUS TO OTHERS (WEAR MASK, WEAR GLOVES, WASH HANDS AND SANITIZE REGULARLY). IF INITIAL TEST IS NEGATIVE, THIS MAY NOT MEAN YOU ARE DEFINITELY NEGATIVE. MOST ACCURATE TESTING IS DONE 5-7 DAYS AFTER EXPOSURE.   It is not advised by CDC to get re-tested after receiving a positive COVID test since you can still test positive for weeks to months after you have already cleared the virus.   *If you have not been vaccinated for COVID, I strongly suggest you  consider getting vaccinated as long as there are no contraindications.      ED Prescriptions    Medication Sig Dispense Auth. Provider   promethazine-dextromethorphan (PROMETHAZINE-DM) 6.25-15 MG/5ML syrup Take 5 mLs by mouth 4 (four) times daily as needed for cough. 118 mL Eusebio Friendly B, PA-C   ipratropium (ATROVENT) 0.06 % nasal spray Place 2 sprays into both nostrils 4 (four) times daily. 15 mL Shirlee Latch, PA-C     I have reviewed the PDMP during this encounter.   Shirlee Latch, PA-C 01/31/21 1042

## 2021-01-31 NOTE — Discharge Instructions (Addendum)

## 2021-01-31 NOTE — ED Triage Notes (Signed)
Patient in today c/o nasal congestion (green), cough, sore throat and diarrhea x 3 days. Patient has not taken her temperature. Patient has taken OTC Afrin, saline wash, vicks rub, cough drops, Claritin and Tylenol. Patient has not had the covid vaccines.

## 2021-02-01 LAB — SARS CORONAVIRUS 2 (TAT 6-24 HRS): SARS Coronavirus 2: NEGATIVE

## 2021-07-09 ENCOUNTER — Encounter: Payer: Self-pay | Admitting: Emergency Medicine

## 2021-07-09 ENCOUNTER — Ambulatory Visit
Admission: EM | Admit: 2021-07-09 | Discharge: 2021-07-09 | Disposition: A | Discharge status: Home / Self Care | Payer: BLUE CROSS/BLUE SHIELD | Attending: Emergency Medicine | Admitting: Emergency Medicine

## 2021-07-09 ENCOUNTER — Other Ambulatory Visit: Payer: Self-pay

## 2021-07-09 DIAGNOSIS — R300 Dysuria: Secondary | ICD-10-CM

## 2021-07-09 DIAGNOSIS — K047 Periapical abscess without sinus: Secondary | ICD-10-CM | POA: Diagnosis not present

## 2021-07-09 DIAGNOSIS — N898 Other specified noninflammatory disorders of vagina: Secondary | ICD-10-CM | POA: Insufficient documentation

## 2021-07-09 DIAGNOSIS — F430 Acute stress reaction: Secondary | ICD-10-CM | POA: Insufficient documentation

## 2021-07-09 DIAGNOSIS — K0889 Other specified disorders of teeth and supporting structures: Secondary | ICD-10-CM | POA: Insufficient documentation

## 2021-07-09 LAB — POCT URINALYSIS DIP (DEVICE)
Bilirubin Urine: NEGATIVE
Glucose, UA: NEGATIVE mg/dL
Ketones, ur: NEGATIVE mg/dL
Nitrite: POSITIVE — AB
Protein, ur: 30 mg/dL — AB
Specific Gravity, Urine: 1.025 (ref 1.005–1.030)
Urobilinogen, UA: 1 mg/dL (ref 0.0–1.0)
pH: 5.5 (ref 5.0–8.0)

## 2021-07-09 MED ORDER — FLUCONAZOLE 150 MG PO TABS: 150.0000 mg | ORAL_TABLET | Freq: Once | ORAL | 0 refills | Status: AC

## 2021-07-09 MED ORDER — NAPROXEN 500 MG PO TBEC: 500.0000 mg | DELAYED_RELEASE_TABLET | Freq: Two times a day (BID) | ORAL | 0 refills | Status: AC | PRN

## 2021-07-09 MED ORDER — AMOXICILLIN-POT CLAVULANATE 875-125 MG PO TABS: 1.0000 | ORAL_TABLET | Freq: Two times a day (BID) | ORAL | 0 refills | Status: AC

## 2021-07-09 NOTE — ED Provider Notes (Signed)
[facility]    CSN: [id number] Arrival date & time: 07/09/21  1735      History   Chief Complaint Chief Complaint  Patient presents with   Dental Pain   Dysuria    HPI Jasmine [name] is a 40 y.o. female.   40 year old female presents with multiple concerns. Started with occasional upper tooth/dental pain about 3 weeks ago. Pain got worse about 9 days ago and started taking an old "left over antibiotic"- does not recall name and uncertain how many days she took medication. Tooth pain was improving but started having some vaginal itching about 5 days ago. Used OTC anti-fungal vaginal topical medication with some relief in itching and discharge but now having dysuria, urinary frequency and incontinence for the past 3 days and getting worse. Upper tooth pain has also returned and now having mainly right sided jaw pain and headaches. Possible fever. No nasal congestion or cough. Has history of dental issues and infections and has partial upper dentures. Has been taking Tylenol, Ibuprofen and Aleve with minimal relief. Also taking AZO and Probiotics with minimal relief. Smokes tobacco daily. Has history of Hep C (treated) and substance abuse so concerned over amount of pain medication she is currently taking with minimal relief. Also very stressed with recent death of Mom from COVID 2023/02/19, her husband does not drive and her son is not old enough to drive so difficulty with transportation. New job and concerned over missing work due to health issues. No other daily medication.   The history is provided by the patient.   Past Medical History:  Diagnosis Date   Hep C w/o coma, chronic (HCC)    PMDD (premenstrual dysphoric disorder)    Substance abuse (HCC)     Patient Active Problem List   Diagnosis Date Noted   Hepatitis C 07/28/2018   Seizures (HCC) 11/23/2017   Trichomonas vaginitis 12/31/2015   Accidental paracetamol poisoning 12/30/2015   Chronic bilateral low back pain  without sciatica 12/30/2015   Dental infection 12/30/2015   Tobacco abuse disorder 12/30/2015   Transaminitis 12/30/2015   Heroin use disorder, moderate, dependence (HCC) 02/24/2015   Opioid dependence with intoxication delirium (HCC)    Polysubstance abuse (HCC)     Past Surgical History:  Procedure Laterality Date   KNEE ARTHROCENTESIS Left    PATELLECTOMY     TUBAL LIGATION N/A    TUBAL LIGATION      OB History   No obstetric history on file.      Home Medications    Prior to Admission medications   Medication Sig Start Date End Date Taking? Authorizing Provider  amoxicillin-clavulanate (AUGMENTIN) 875-125 MG tablet Take 1 tablet by mouth every 12 (twelve) hours for 10 days. 07/09/21 07/19/21 Yes Yunique Dearcos, Ali Lowe, NP  naproxen (EC NAPROSYN) 500 MG EC tablet Take 1 tablet (500 mg total) by mouth every 12 (twelve) hours as needed (for pain). 07/09/21  Yes Vonetta Foulk, Ali Lowe, NP  traZODone (DESYREL) 50 MG tablet TAKE ONE AT BEDTIME NIGHTLY AS NEEDED FOR SLEEP 03/17/19 07/16/19  Jomarie Longs, MD    Family History Family History  Problem Relation Age of Onset   Depression Mother    Other Mother        covid   Hyperlipidemia Mother    COPD Mother    Heart attack Mother    Crohn's disease Father    COPD Father    Breast cancer Sister 1   Depression Brother  Social History Social History   Tobacco Use   Smoking status: Every Day    Packs/day: 1.00    Types: Cigarettes   Smokeless tobacco: Never  Vaping Use   Vaping Use: Never used  Substance Use Topics   Alcohol use: Never   Drug use: Not Currently    Types: Heroin, IV    Comment: heroin last use 11/16/17     Allergies   Hydrocodone-acetaminophen   Review of Systems Review of Systems  Constitutional:  Positive for appetite change (difficulty eating due to tooth pain), fatigue and fever.  HENT:  Positive for dental problem and sinus pain. Negative for congestion, ear discharge, ear pain, facial swelling,  postnasal drip, rhinorrhea, sore throat and trouble swallowing.   Respiratory:  Negative for cough, shortness of breath and wheezing.   Gastrointestinal:  Positive for abdominal pain and nausea. Negative for vomiting.  Genitourinary:  Positive for decreased urine volume, difficulty urinating, dysuria, frequency, urgency and vaginal discharge (irritation). Negative for genital sores, hematuria, pelvic pain and vaginal bleeding.  Musculoskeletal:  Positive for arthralgias and back pain. Negative for neck pain and neck stiffness.  Skin:  Negative for color change and rash.  Allergic/Immunologic: Negative for environmental allergies and food allergies.  Neurological:  Positive for headaches. Negative for dizziness, tremors, seizures, syncope, speech difficulty and numbness.  Hematological:  Negative for adenopathy. Does not bruise/bleed easily.  Psychiatric/Behavioral:  The patient is nervous/anxious.     Physical Exam Triage Vital Signs ED Triage Vitals  Enc Vitals Group     BP 07/09/21 1759 (!) 139/93     Pulse Rate 07/09/21 1759 (!) 103     Resp 07/09/21 1759 18     Temp 07/09/21 1759 98.1 F (36.7 C)     Temp Source 07/09/21 1759 Oral     SpO2 07/09/21 1759 96 %     Weight 07/09/21 1756 229 lb 15 oz (104.3 kg)     Height 07/09/21 1756 5\' 10"  (1.778 m)     Head Circumference --      Peak Flow --      Pain Score 07/09/21 1754 8     Pain Loc --      Pain Edu? --      Excl. in GC? --    No data found.  Updated Vital Signs BP (!) 139/93 (BP Location: Left Arm)   Pulse (!) 103   Temp 98.1 F (36.7 C) (Oral)   Resp 18   Ht 5\' 10"  (1.778 m)   Wt 229 lb 15 oz (104.3 kg)   LMP 07/02/2021 (Approximate)   SpO2 96%   BMI 32.99 kg/m   Visual Acuity Right Eye Distance:   Left Eye Distance:   Bilateral Distance:    Right Eye Near:   Left Eye Near:    Bilateral Near:     Physical Exam Vitals and nursing note reviewed.  Constitutional:      General: She is awake. She is not  in acute distress.    Appearance: She is well-developed and well-groomed.     Comments: She is sitting in the exam chair in no acute distress but appears tired, in pain and tearful.   HENT:     Head: Normocephalic and atraumatic.     Comments: No distinct facial swelling. No external maxillary tenderness.     Right Ear: Hearing, tympanic membrane, ear canal and external ear normal.     Left Ear: Hearing, tympanic membrane, ear canal and external  ear normal.     Nose: Nose normal.     Right Sinus: No maxillary sinus tenderness or frontal sinus tenderness.     Left Sinus: No maxillary sinus tenderness or frontal sinus tenderness.     Mouth/Throat:     Lips: Pink.     Mouth: Mucous membranes are moist.     Dentition: Has dentures (partial). Dental tenderness, gingival swelling, dental caries and dental abscesses present.     Pharynx: Oropharynx is clear. Uvula midline. No pharyngeal swelling, oropharyngeal exudate, posterior oropharyngeal erythema or uvula swelling.      Comments: Patient removed partial dentures to assist with exam. About 4 remaining upper teeth- mainly molars with gum redness, swelling and irritation along with tenderness. Remaining teeth in poor repair. No distinct discharge seen. No other oral lesions.  Eyes:     General: Lids are normal.     Extraocular Movements: Extraocular movements intact.     Conjunctiva/sclera:     Right eye: Right conjunctiva is injected.     Left eye: Left conjunctiva is injected.     Comments: Eyes red due to crying.   Cardiovascular:     Rate and Rhythm: Regular rhythm. Tachycardia present.     Heart sounds: Normal heart sounds. No murmur heard. Pulmonary:     Effort: Pulmonary effort is normal. No respiratory distress.     Breath sounds: Normal breath sounds and air entry. No decreased air movement. No decreased breath sounds, wheezing, rhonchi or rales.  Abdominal:     General: Bowel sounds are normal. There is no distension.      Palpations: Abdomen is soft.     Tenderness: There is abdominal tenderness in the suprapubic area. There is no right CVA tenderness, left CVA tenderness, guarding or rebound.    Genitourinary:    Comments: Patient declined pelvic exam.  Musculoskeletal:        General: Normal range of motion.     Cervical back: Normal range of motion and neck supple.  Lymphadenopathy:     Cervical: No cervical adenopathy.  Skin:    General: Skin is warm and dry.     Capillary Refill: Capillary refill takes less than 2 seconds.     Findings: No rash.  Neurological:     General: No focal deficit present.     Mental Status: She is alert and oriented to person, place, and time.  Psychiatric:        Attention and Perception: Attention normal.        Mood and Affect: Mood is anxious. Affect is tearful.        Speech: Speech normal.        Behavior: Behavior is cooperative.        Thought Content: Thought content normal.        Cognition and Memory: Cognition normal.        Judgment: Judgment normal.     UC Treatments / Results  Labs (all labs ordered are listed, but only abnormal results are displayed) Labs Reviewed  POCT URINALYSIS DIP (DEVICE) - Abnormal; Notable for the following components:      Result Value   Hgb urine dipstick TRACE (*)    Protein, ur 30 (*)    Nitrite POSITIVE (*)    Leukocytes,Ua SMALL (*)    All other components within normal limits  URINE CULTURE  POCT URINALYSIS DIPSTICK, ED / UC  CERVICOVAGINAL ANCILLARY ONLY    EKG   Radiology No results found.  Procedures  Procedures (including critical care time)  Medications Ordered in UC Medications - No data to display  Initial Impression / Assessment and Plan / UC Course  I have reviewed the triage vital signs and the nursing notes.  Pertinent labs & imaging results that were available during my care of the patient were reviewed by me and considered in my medical decision making (see chart for details).      Reviewed urinalysis results with patient- positive blood, protein, WBC's and nitrites- probable UTI. Will send urine for culture. Discussed that she also probably has multiple dental infections/abscess. To minimize medication use- will treat both UTI and dental infection with Augmentin 875mg  twice a day as directed for now. May need to change antibiotic pending urine culture and STD/Vaginitis testing. Recommend start Diflucan 150mg  once tonight, then repeat 1 tablet in 3 days and take final tablet at end of antibiotic to help prevent/treat antibiotic-associated yeast vaginitis. Recommend Naproxen 500mg  twice a day as directed for pain. Continue to use salt water gargles to help with dental infection. Recommend call her Dentist to schedule appointment for follow-up. Note written for work. If pain gets worse, go to the ER ASAP. Otherwise, follow-up pending urine culture and lab results and with Dentist as planned.  Final Clinical Impressions(s) / UC Diagnoses   Final diagnoses:  Dental abscess  Pain, dental  Dysuria  Vaginal irritation  Stress disorder, acute     Discharge Instructions      Recommend start Augmentin 875mg  twice a day as directed. Recommend take Diflucan 150mg  once tonight, then repeat 1 tablet in 3 days and then repeat final tablet at end of Augmentin use. May take Naproxen 500mg  twice a day as directed for pain. Continue to use salt water gargles as needed for comfort. If pain gets worse, go to the ER ASAP. Otherwise, follow-up pending urine culture and lab results.      ED Prescriptions     Medication Sig Dispense Auth. Provider   amoxicillin-clavulanate (AUGMENTIN) 875-125 MG tablet Take 1 tablet by mouth every 12 (twelve) hours for 10 days. 20 tablet , NP   fluconazole (DIFLUCAN) 150 MG tablet Take 1 tablet (150 mg total) by mouth once for 1 dose. Repeat 1 tablet in 3 days and then last tablet on the last day of antibiotic 3 tablet Patrick Salemi, , NP    naproxen (EC NAPROSYN) 500 MG EC tablet Take 1 tablet (500 mg total) by mouth every 12 (twelve) hours as needed (for pain). 30 tablet Daquane Aguilar, , NP      PDMP not reviewed this encounter.   , NP 07/10/21 1148

## 2021-07-09 NOTE — Discharge Instructions (Addendum)
Recommend start Augmentin 875mg  twice a day as directed. Recommend take Diflucan 150mg  once tonight, then repeat 1 tablet in 3 days and then repeat final tablet at end of Augmentin use. May take Naproxen 500mg  twice a day as directed for pain. Continue to use salt water gargles as needed for comfort. If pain gets worse, go to the ER ASAP. Otherwise, follow-up pending urine culture and lab results.

## 2021-07-09 NOTE — ED Triage Notes (Signed)
Pt c/o upper bilateral dental pain. Started about 9 days ago. Pt states she took an old antibiotic and had vaginal itching. She states she took OTC yeast treatment and the itching went away how ever she is now having dysuria, incontinence, urinary retention. Started about 3 days ago. Pt tearful during triage.

## 2021-07-10 LAB — CERVICOVAGINAL ANCILLARY ONLY
Bacterial Vaginitis (gardnerella): POSITIVE — AB
Candida Glabrata: NEGATIVE
Candida Vaginitis: POSITIVE — AB
Chlamydia: NEGATIVE
Comment: NEGATIVE
Comment: NEGATIVE
Comment: NEGATIVE
Comment: NEGATIVE
Comment: NEGATIVE
Comment: NORMAL
Neisseria Gonorrhea: NEGATIVE
Trichomonas: POSITIVE — AB

## 2021-07-11 ENCOUNTER — Telehealth (HOSPITAL_COMMUNITY): Payer: Self-pay | Admitting: Emergency Medicine

## 2021-07-11 LAB — URINE CULTURE: Culture: >=100000 — AB

## 2021-07-11 MED ORDER — METRONIDAZOLE 500 MG PO TABS: 500.0000 mg | ORAL_TABLET | Freq: Two times a day (BID) | ORAL | 0 refills | Status: AC

## 2023-06-09 ENCOUNTER — Ambulatory Visit
Admission: EM | Admit: 2023-06-09 | Discharge: 2023-06-09 | Disposition: A | Discharge status: Home / Self Care | Payer: 59 | Attending: Family Medicine | Admitting: Family Medicine

## 2023-06-09 DIAGNOSIS — R300 Dysuria: Secondary | ICD-10-CM | POA: Insufficient documentation

## 2023-06-09 DIAGNOSIS — N390 Urinary tract infection, site not specified: Secondary | ICD-10-CM | POA: Insufficient documentation

## 2023-06-09 LAB — POCT URINALYSIS DIP (MANUAL ENTRY)
Bilirubin, UA: NEGATIVE
Glucose, UA: NEGATIVE mg/dL
Ketones, POC UA: NEGATIVE mg/dL
Nitrite, UA: NEGATIVE
Protein Ur, POC: 100 mg/dL — AB
Spec Grav, UA: 1.02 (ref 1.010–1.025)
Urobilinogen, UA: 0.2 E.U./dL
pH, UA: 6 (ref 5.0–8.0)

## 2023-06-09 LAB — POCT URINE PREGNANCY: Preg Test, Ur: NEGATIVE

## 2023-06-09 MED ORDER — SULFAMETHOXAZOLE-TRIMETHOPRIM 800-160 MG PO TABS: 1.0000 | ORAL_TABLET | Freq: Two times a day (BID) | ORAL | 0 refills | Status: AC

## 2023-06-09 NOTE — ED Provider Notes (Signed)
Jasmine [name]    CSN: [id number] Arrival date & time: 06/09/23  1005      History   Chief Complaint Chief Complaint  Patient presents with   Dysuria   Urinary Frequency    HPI Jasmine [name] is a 42 y.o. female.   Jasmine [name] is a 42 y.o. female presents for evaluation of urinary frequency, urgency and dysuria x 1-2 days, without flank pain, fever, chills, or abnormal vaginal discharge or bleeding. Reports no concern for STD, concerned for possible BV and or yeast.  Previous history of UTI last July and review of last urine culture, E. Coli was the bacteria present in urine. She was treated for UTI via a telemedicine with nitrofurantoin back in early July and is concerned symptoms never completely resolved and is requesting "stronger medication" medication this time.  Patient's last menstrual period was 06/09/2023.   Past Medical History:  Diagnosis Date   Hep C w/o coma, chronic (HCC)    PMDD (premenstrual dysphoric disorder)    Substance abuse (HCC)     Patient Active Problem List   Diagnosis Date Noted   Hepatitis C 07/28/2018   Seizures (HCC) 11/23/2017   Trichomonas vaginitis 12/31/2015   Accidental paracetamol poisoning 12/30/2015   Chronic bilateral low back pain without sciatica 12/30/2015   Dental infection 12/30/2015   Tobacco abuse disorder 12/30/2015   Transaminitis 12/30/2015   Heroin use disorder, moderate, dependence (HCC) 02/24/2015   Opioid dependence with intoxication delirium (HCC)    Polysubstance abuse (HCC)     Past Surgical History:  Procedure Laterality Date   KNEE ARTHROCENTESIS Left    PATELLECTOMY     TUBAL LIGATION N/A    TUBAL LIGATION      OB History   No obstetric history on file.      Home Medications    Prior to Admission medications   Medication Sig Start Date End Date Taking? Authorizing Provider  sulfamethoxazole-trimethoprim (BACTRIM DS) 800-160 MG tablet Take 1 tablet by mouth 2 (two) times daily for 5  days. 06/09/23 06/14/23 Yes Bing Neighbors, NP  metroNIDAZOLE (FLAGYL) 500 MG tablet Take 1 tablet (500 mg total) by mouth 2 (two) times daily. 07/11/21   Merrilee Jansky, MD  naproxen (EC NAPROSYN) 500 MG EC tablet Take 1 tablet (500 mg total) by mouth every 12 (twelve) hours as needed (for pain). 07/09/21   Sudie Grumbling, NP  traZODone (DESYREL) 50 MG tablet TAKE ONE AT BEDTIME NIGHTLY AS NEEDED FOR SLEEP 03/17/19 07/16/19  Jomarie Longs, MD    Family History Family History  Problem Relation Age of Onset   Depression Mother    Other Mother        covid   Hyperlipidemia Mother    COPD Mother    Heart attack Mother    Crohn's disease Father    COPD Father    Breast cancer Sister 76   Depression Brother     Social History Social History   Tobacco Use   Smoking status: Every Day    Current packs/day: 1.00    Types: Cigarettes   Smokeless tobacco: Never  Vaping Use   Vaping status: Never Used  Substance Use Topics   Alcohol use: Never   Drug use: Not Currently    Types: Heroin, IV    Comment: heroin last use 11/16/17     Allergies   Hydrocodone-acetaminophen   Review of Systems Review of Systems  Genitourinary:  Positive for dysuria and  frequency.     Physical Exam Triage Vital Signs ED Triage Vitals  Encounter Vitals Group     BP 06/09/23 1053 129/78     Systolic BP Percentile --      Diastolic BP Percentile --      Pulse Rate 06/09/23 1053 80     Resp 06/09/23 1053 16     Temp 06/09/23 1053 97.8 F (36.6 C)     Temp Source 06/09/23 1053 Temporal     SpO2 06/09/23 1053 97 %     Weight --      Height --      Head Circumference --      Peak Flow --      Pain Score 06/09/23 1051 0     Pain Loc --      Pain Education --      Exclude from Growth Chart --    No data found.  Updated Vital Signs BP 129/78 (BP Location: Left Arm)   Pulse 80   Temp 97.8 F (36.6 C) (Temporal)   Resp 16   LMP 06/09/2023   SpO2 97%   Visual Acuity Right Eye  Distance:   Left Eye Distance:   Bilateral Distance:    Right Eye Near:   Left Eye Near:    Bilateral Near:     Physical Exam General appearance: Alert, well developed, well nourished, cooperative and in no distress Head: Normocephalic, without obvious abnormality, atraumatic Respiratory: Respirations even and unlabored, normal respiratory rate Heart: rate and rhythm normal.   CVA:  No flank pain Extremities: No gross deformities Skin: Skin color, texture, turgor normal. No rashes seen  Psych: Appropriate mood and affect.   UC Treatments / Results  Labs (all labs ordered are listed, but only abnormal results are displayed) Labs Reviewed  POCT URINALYSIS DIP (MANUAL ENTRY) - Abnormal; Notable for the following components:      Result Value   Clarity, UA cloudy (*)    Blood, UA large (*)    Protein Ur, POC =100 (*)    Leukocytes, UA Small (1+) (*)    All other components within normal limits  URINE CULTURE  POCT URINE PREGNANCY  CERVICOVAGINAL ANCILLARY ONLY    EKG   Radiology No results found.  Procedures Procedures (including critical care time)  Medications Ordered in UC Medications - No data to display  Initial Impression / Assessment and Plan / UC Course  I have reviewed the triage vital signs and the nursing notes.  Pertinent labs & imaging results that were available during my care of the patient were reviewed by me and considered in my medical decision making (see chart for details).      UA abnormal and findings consistent with UTI. Empiric antibiotic treatment initiated.  Urine culture pending.  ER if symptoms become severe.Follow-up with PCP if symptoms do not completely resolve.  Final Clinical Impressions(s) / UC Diagnoses   Final diagnoses:  Dysuria  Acute UTI     Discharge Instructions      You are being treated today for urinary tract infection. Start  your antibiotic treatment and take twice daily over the course of the next 5 days.   Our office will reach out to you directly if your urine culture indicates that your treatment should be changed. Cytology results will update to MyChart if any treatment is needed our office will contact you directly either via MyChart or by phone.     ED Prescriptions  Medication Sig Dispense Auth. Provider   sulfamethoxazole-trimethoprim (BACTRIM DS) 800-160 MG tablet Take 1 tablet by mouth 2 (two) times daily for 5 days. 10 tablet Bing Neighbors, NP      PDMP not reviewed this encounter.   Bing Neighbors, NP 06/09/23 [phone]

## 2023-06-09 NOTE — Discharge Instructions (Addendum)
You are being treated today for urinary tract infection. Start  your antibiotic treatment and take twice daily over the course of the next 5 days.  Our office will reach out to you directly if your urine culture indicates that your treatment should be changed. Cytology results will update to MyChart if any treatment is needed our office will contact you directly either via MyChart or by phone.

## 2023-06-09 NOTE — ED Triage Notes (Signed)
Patient presents to UC for urinary freq., dysuria, and bladder spasm since yesterday. No OTC meds, last UTI in July.

## 2023-06-10 LAB — CERVICOVAGINAL ANCILLARY ONLY
Bacterial Vaginitis (gardnerella): POSITIVE — AB
Candida Glabrata: POSITIVE — AB
Candida Vaginitis: POSITIVE — AB
Chlamydia: NEGATIVE
Comment: NEGATIVE
Comment: NEGATIVE
Comment: NEGATIVE
Comment: NEGATIVE
Comment: NEGATIVE
Comment: NORMAL
Neisseria Gonorrhea: NEGATIVE
Trichomonas: POSITIVE — AB

## 2023-06-11 ENCOUNTER — Telehealth: Payer: Self-pay

## 2023-06-11 LAB — URINE CULTURE
Culture: 20000 — AB
Special Requests: NORMAL

## 2023-06-11 MED ORDER — FLUCONAZOLE 150 MG PO TABS: 150.0000 mg | ORAL_TABLET | Freq: Once | ORAL | 0 refills | Status: AC

## 2023-06-11 MED ORDER — METRONIDAZOLE 500 MG PO TABS: 500.0000 mg | ORAL_TABLET | Freq: Two times a day (BID) | ORAL | 0 refills | Status: AC

## 2023-06-11 NOTE — Telephone Encounter (Signed)
 Per protocol, pt requires tx with metronidazole and Diflucan. Attempted to reach patient x1. LVM. Rx sent to pharmacy on file.

## 2023-06-29 ENCOUNTER — Ambulatory Visit: Payer: 59 | Admitting: Family

## 2023-09-03 ENCOUNTER — Ambulatory Visit
Admission: EM | Admit: 2023-09-03 | Discharge: 2023-09-03 | Disposition: A | Discharge status: Home / Self Care | Payer: 59 | Attending: Internal Medicine | Admitting: Internal Medicine

## 2023-09-03 DIAGNOSIS — H1131 Conjunctival hemorrhage, right eye: Secondary | ICD-10-CM | POA: Diagnosis not present

## 2023-09-03 NOTE — ED Provider Notes (Signed)
[facility]    CSN: [id number] Arrival date & time: 09/03/23  1858      History   Chief Complaint Chief Complaint  Patient presents with   Eye Problem    HPI Jasmine [name] is a 42 y.o. female presents for eye concern.  Patient reports this morning when putting on make-up she noticed some redness on the underside of her right upper lid.  She denies any injury to the eye including poking the eye or rubbing it.  She does states she had a coughing fit not too long ago.  Denies any pain to the eye visual changes, drainage, foreign body sensation, photosensitivity.  No pain with eye movement.  Wears glasses only.  She is not on blood thinning medications.  She went to see a pharmacist and they said that it might be infected and to have it checked out.  No OTC medications have been used.  No other concerns at this time.   Eye Problem   Past Medical History:  Diagnosis Date   Hep C w/o coma, chronic (HCC)    PMDD (premenstrual dysphoric disorder)    Substance abuse (HCC)     Patient Active Problem List   Diagnosis Date Noted   Hepatitis C 07/28/2018   Seizures (HCC) 11/23/2017   Trichomonas vaginitis 12/31/2015   Accidental paracetamol poisoning 12/30/2015   Chronic bilateral low back pain without sciatica 12/30/2015   Dental infection 12/30/2015   Tobacco abuse disorder 12/30/2015   Transaminitis 12/30/2015   Heroin use disorder, moderate, dependence (HCC) 02/24/2015   Opioid dependence with intoxication delirium (HCC)    Polysubstance abuse (HCC)     Past Surgical History:  Procedure Laterality Date   KNEE ARTHROCENTESIS Left    PATELLECTOMY     TUBAL LIGATION N/A    TUBAL LIGATION      OB History   No obstetric history on file.      Home Medications    Prior to Admission medications   Medication Sig Start Date End Date Taking? Authorizing Provider  metroNIDAZOLE (FLAGYL) 500 MG tablet Take 1 tablet (500 mg total) by mouth 2 (two) times daily.  07/11/21  Yes Lamptey, Britta Mccreedy, MD  naproxen (EC NAPROSYN) 500 MG EC tablet Take 1 tablet (500 mg total) by mouth every 12 (twelve) hours as needed (for pain). 07/09/21  Yes Amyot, Ali Lowe, NP  traZODone (DESYREL) 50 MG tablet TAKE ONE AT BEDTIME NIGHTLY AS NEEDED FOR SLEEP 03/17/19 07/16/19  Jomarie Longs, MD    Family History Family History  Problem Relation Age of Onset   Depression Mother    Other Mother        covid   Hyperlipidemia Mother    COPD Mother    Heart attack Mother    Crohn's disease Father    COPD Father    Breast cancer Sister 40   Depression Brother     Social History Social History   Tobacco Use   Smoking status: Every Day    Current packs/day: 1.00    Types: Cigarettes   Smokeless tobacco: Never  Vaping Use   Vaping status: Never Used  Substance Use Topics   Alcohol use: Never   Drug use: Not Currently    Types: Heroin, IV    Comment: heroin last use 11/16/17     Allergies   Hydrocodone-acetaminophen   Review of Systems Review of Systems  Eyes:        Eye concern  Physical Exam Triage Vital Signs ED Triage Vitals  Encounter Vitals Group     BP 09/03/23 1939 122/84     Systolic BP Percentile --      Diastolic BP Percentile --      Pulse Rate 09/03/23 1939 86     Resp --      Temp 09/03/23 1939 98.3 F (36.8 C)     Temp Source 09/03/23 1939 Oral     SpO2 09/03/23 1939 98 %     Weight 09/03/23 1936 230 lb (104.3 kg)     Height 09/03/23 1936 5\' 10"  (1.778 m)     Head Circumference --      Peak Flow --      Pain Score 09/03/23 1935 1     Pain Loc --      Pain Education --      Exclude from Growth Chart --    No data found.  Updated Vital Signs BP 122/84 (BP Location: Left Arm)   Pulse 86   Temp 98.3 F (36.8 C) (Oral)   Ht 5\' 10"  (1.778 m)   Wt 230 lb (104.3 kg)   LMP 08/04/2023   SpO2 98%   BMI 33.00 kg/m   Visual Acuity Right Eye Distance: 20/25 Left Eye Distance: 20/25 Bilateral Distance: 20/20  Right Eye  Near:   Left Eye Near:    Bilateral Near:   (Pt wears prescription glasses)  Physical Exam Vitals and nursing note reviewed.  Constitutional:      General: She is not in acute distress.    Appearance: Normal appearance. She is not ill-appearing.  HENT:     Head: Normocephalic and atraumatic.  Eyes:     General: Lids are normal.     Extraocular Movements: Extraocular movements intact.     Right eye: Normal extraocular motion.     Conjunctiva/sclera:     Right eye: Right conjunctiva is not injected. Hemorrhage present. No chemosis or exudate.    Pupils: Pupils are equal, round, and reactive to light.      Comments: There is a small healing subconjunctival hemorrhage to the right eye just underneath the upper eyelid.  There is no periorbital swelling or redness.  Cardiovascular:     Rate and Rhythm: Normal rate.  Pulmonary:     Effort: Pulmonary effort is normal.  Skin:    General: Skin is warm and dry.  Neurological:     General: No focal deficit present.     Mental Status: She is alert and oriented to person, place, and time.  Psychiatric:        Mood and Affect: Mood normal.        Behavior: Behavior normal.    Visual acuity corrected Right 20/25, left 20/25, bilateral 20/20  UC Treatments / Results  Labs (all labs ordered are listed, but only abnormal results are displayed) Labs Reviewed - No data to display  EKG   Radiology No results found.  Procedures Procedures (including critical care time)  Medications Ordered in UC Medications - No data to display  Initial Impression / Assessment and Plan / UC Course  I have reviewed the triage vital signs and the nursing notes.  Pertinent labs & imaging results that were available during my care of the patient were reviewed by me and considered in my medical decision making (see chart for details).     Reviewed exam and symptoms with patient. No red flags on exam.  Discussed subconjunctival hemorrhage and that  it  is self resolving.  Advised to continue to monitor.  Follow-up with ophthalmology as needed.  ER precautions reviewed. Final Clinical Impressions(s) / UC Diagnoses   Final diagnoses:  Subconjunctival hemorrhage of right eye   Discharge Instructions   None    ED Prescriptions   None    PDMP not reviewed this encounter.   Radford Pax, NP 09/03/23 2000

## 2023-09-03 NOTE — Discharge Instructions (Addendum)
Your symptoms will resolve on their own.  Please follow-up with your ophthalmology if you have any change in vision, eye pain or any new concerns that arise.  I hope you feel better soon.

## 2023-09-03 NOTE — ED Triage Notes (Signed)
Pt c/o blood in the sclera of the right eye  Pt states that she smoked pot and had a coughing fit and does not know if that broke the blood vessels  Pt states that her eye turned yellow and is unsure if her eye is healing or infected.
